# Patient Record
Sex: Female | Born: 1990 | Race: Black or African American | Hispanic: No | Marital: Single | Smoking: Never smoker
Health system: Southern US, Community
[De-identification: ages and names within clinical notes are randomized; demographics above are authoritative.]

## PROBLEM LIST (undated history)

## (undated) DIAGNOSIS — K589 Irritable bowel syndrome without diarrhea: Secondary | ICD-10-CM

---
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
## 2009-12-22 ENCOUNTER — Emergency Department (HOSPITAL_COMMUNITY): Admission: EM | Admit: 2009-12-22 | Discharge: 2009-12-23 | Payer: Self-pay | Admitting: Emergency Medicine

## 2010-09-30 ENCOUNTER — Emergency Department (HOSPITAL_COMMUNITY): Payer: PRIVATE HEALTH INSURANCE

## 2010-09-30 ENCOUNTER — Emergency Department (HOSPITAL_COMMUNITY)
Admission: EM | Admit: 2010-09-30 | Discharge: 2010-09-30 | Disposition: A | Discharge status: Home / Self Care | Payer: PRIVATE HEALTH INSURANCE | Attending: Emergency Medicine | Admitting: Emergency Medicine

## 2010-09-30 DIAGNOSIS — R109 Unspecified abdominal pain: Secondary | ICD-10-CM | POA: Insufficient documentation

## 2010-09-30 LAB — COMPREHENSIVE METABOLIC PANEL
ALT: 38 U/L — ABNORMAL HIGH (ref 0–35)
BUN: 8 mg/dL (ref 6–23)
CO2: 26 mEq/L (ref 19–32)
Calcium: 9.3 mg/dL (ref 8.4–10.5)
GFR calc Af Amer: >60 mL/min (ref >60)
GFR calc non Af Amer: >60 mL/min (ref >60)
Glucose, Bld: 90 mg/dL (ref 70–99)
Sodium: 138 mEq/L (ref 135–145)

## 2010-09-30 LAB — DIFFERENTIAL
Basophils Relative: 0 % (ref 0–1)
Lymphocytes Relative: 37 % (ref 12–46)
Lymphs Abs: 2.1 10*3/uL (ref 0.7–4.0)
Monocytes Relative: 8 % (ref 3–12)
Neutro Abs: 3.1 10*3/uL (ref 1.7–7.7)
Neutrophils Relative %: 54 % (ref 43–77)

## 2010-09-30 LAB — URINALYSIS, ROUTINE W REFLEX MICROSCOPIC
Glucose, UA: NEGATIVE mg/dL
Hgb urine dipstick: NEGATIVE
Ketones, ur: NEGATIVE mg/dL
Protein, ur: NEGATIVE mg/dL
Urobilinogen, UA: 0.2 mg/dL (ref 0.0–1.0)

## 2010-09-30 LAB — CBC
HCT: 36.1 % (ref 36.0–46.0)
Hemoglobin: 11.6 g/dL — ABNORMAL LOW (ref 12.0–15.0)
MCH: 27 pg (ref 26.0–34.0)
MCV: 84.1 fL (ref 78.0–100.0)
RBC: 4.29 MIL/uL (ref 3.87–5.11)

## 2010-09-30 LAB — LIPASE, BLOOD: Lipase: 18 U/L (ref 11–59)

## 2011-02-18 ENCOUNTER — Encounter: Payer: Self-pay | Admitting: *Deleted

## 2011-02-18 ENCOUNTER — Emergency Department (HOSPITAL_COMMUNITY)
Admission: EM | Admit: 2011-02-18 | Discharge: 2011-02-18 | Disposition: A | Discharge status: Home / Self Care | Payer: PRIVATE HEALTH INSURANCE | Attending: Emergency Medicine | Admitting: Emergency Medicine

## 2011-02-18 DIAGNOSIS — R350 Frequency of micturition: Secondary | ICD-10-CM | POA: Insufficient documentation

## 2011-02-18 DIAGNOSIS — N390 Urinary tract infection, site not specified: Secondary | ICD-10-CM | POA: Insufficient documentation

## 2011-02-18 LAB — URINALYSIS, ROUTINE W REFLEX MICROSCOPIC
Bilirubin Urine: NEGATIVE
Protein, ur: 30 mg/dL — AB
Urobilinogen, UA: 0.2 mg/dL (ref 0.0–1.0)

## 2011-02-18 LAB — URINE MICROSCOPIC-ADD ON

## 2011-02-18 LAB — PREGNANCY, URINE: Preg Test, Ur: NEGATIVE

## 2011-02-18 MED ORDER — CIPROFLOXACIN HCL 500 MG PO TABS: 500.0000 mg | ORAL_TABLET | Freq: Two times a day (BID) | ORAL | Status: AC

## 2011-02-18 NOTE — ED Provider Notes (Signed)
Medical screening examination/treatment/procedure(s) were performed by non-physician practitioner and as supervising physician I was immediately available for consultation/collaboration.  Juliet Rude. Rubin Payor, MD 02/18/11 1627

## 2011-02-18 NOTE — ED Notes (Signed)
Pt reports frequent urination with pain since Friday

## 2011-02-18 NOTE — ED Provider Notes (Signed)
History     CSN: [id number] Arrival date & time: 02/18/2011 11:00 AM   First MD Initiated Contact with Patient 02/18/11 1328    Patient is a 20 y.o. female presenting with frequency. The history is provided by the patient.  Urinary Frequency This is a new problem. The current episode started in the past 7 days. The problem occurs constantly. The problem has been gradually worsening. Pertinent negatives include no abdominal pain, chills, fever, nausea or vomiting. Associated symptoms comments: Urinary urgency, dribbling. Exacerbated by: Urination. She has tried nothing for the symptoms.    History reviewed. No pertinent past medical history.  History reviewed. No pertinent past surgical history.  No family history on file.  History  Substance Use Topics  . Smoking status: Never Smoker   . Smokeless tobacco: Not on file  . Alcohol Use: Yes    OB History    Grav Para Term Preterm Abortions TAB SAB Ect Mult Living                  Review of Systems  Constitutional: Negative for fever and chills.  Gastrointestinal: Negative for nausea, vomiting and abdominal pain.  Genitourinary: Positive for urgency and frequency. Negative for dysuria, vaginal bleeding, vaginal discharge and vaginal pain.    Allergies  Review of patient's allergies indicates no known allergies.  Home Medications  No current outpatient prescriptions on file.  BP 111/65  Pulse 64  Temp(Src) 98 F (36.7 C) (Oral)  Resp 18  SpO2 100%  Physical Exam  Vitals reviewed. Constitutional: She is oriented to person, place, and time. Vital signs are normal. She appears well-developed and well-nourished.  HENT:  Head: Normocephalic and atraumatic.  Eyes: Conjunctivae are normal. Pupils are equal, round, and reactive to light.  Neck: Normal range of motion. Neck supple.  Cardiovascular: Normal rate, regular rhythm and normal heart sounds.  Exam reveals no friction rub.   No murmur heard. Pulmonary/Chest:  Effort normal and breath sounds normal. She has no wheezes. She has no rhonchi. She has no rales. She exhibits no tenderness.  Abdominal: Soft. Bowel sounds are normal. She exhibits no distension and no mass. There is no tenderness. There is no rebound, no guarding and no CVA tenderness.  Musculoskeletal: Normal range of motion.  Neurological: She is alert and oriented to person, place, and time. Coordination normal.  Skin: Skin is warm and dry. No rash noted. No erythema. No pallor.    ED Course  Procedures   Labs Reviewed  URINALYSIS, ROUTINE W REFLEX MICROSCOPIC - Abnormal; Notable for the following:    Appearance TURBID (*)    Hgb urine dipstick MODERATE (*)    Protein, ur 30 (*)    Leukocytes, UA LARGE (*)    All other components within normal limits  URINE MICROSCOPIC-ADD ON - Abnormal; Notable for the following:    Squamous Epithelial / LPF FEW (*)    Bacteria, UA MANY (*)    All other components within normal limits  PREGNANCY, URINE  URINE CULTURE   Will treat for UTI   MDM          Thomasene Lot, PA 02/18/11 1346

## 2011-02-20 LAB — URINE CULTURE: Culture  Setup Time: 201211111739

## 2011-02-21 NOTE — ED Notes (Signed)
+   urine Patient treated with Cipro-sensitive to same-chart appended per protocol MD, 

## 2011-03-30 ENCOUNTER — Encounter: Payer: Self-pay | Admitting: Gynecology

## 2011-03-30 ENCOUNTER — Ambulatory Visit (INDEPENDENT_AMBULATORY_CARE_PROVIDER_SITE_OTHER): Payer: PRIVATE HEALTH INSURANCE | Admitting: Gynecology

## 2011-03-30 DIAGNOSIS — R350 Frequency of micturition: Secondary | ICD-10-CM

## 2011-03-30 DIAGNOSIS — R635 Abnormal weight gain: Secondary | ICD-10-CM

## 2011-03-30 DIAGNOSIS — R3915 Urgency of urination: Secondary | ICD-10-CM

## 2011-03-30 DIAGNOSIS — Z113 Encounter for screening for infections with a predominantly sexual mode of transmission: Secondary | ICD-10-CM

## 2011-03-30 DIAGNOSIS — IMO0001 Reserved for inherently not codable concepts without codable children: Secondary | ICD-10-CM

## 2011-03-30 DIAGNOSIS — Z01419 Encounter for gynecological examination (general) (routine) without abnormal findings: Secondary | ICD-10-CM

## 2011-03-30 NOTE — Progress Notes (Signed)
Cathy Ayers 03/03/1991 [id number]   History:    20 y.o.  for annual exam . Patient stated that she had a Pap smear several years ago. Patient denies any past history of dysplasia. She frequently does her self breast examination. Her poor cycle to be regular monthly. By using any form of contraception. Patient stated that she had been on different birth control pills in the past. Patient is sexually active. Patient has completed the Gardasil vaccine. She had been treated recently for urinary tract infection and still was having some frequency. No dysuria reported fever chills nausea or vomiting. Patient concerned about her weight.  Past medical history,surgical history, family history and social history were all reviewed and documented in the EPIC chart.  Gynecologic History Patient's last menstrual period was 03/07/2011. Contraception: none Last Pap: Unknown. Results were: normal Last mammogram: Not indicated. Results were: Not indicated  Obstetric History OB History    Grav Para Term Preterm Abortions TAB SAB Ect Mult Living   0                ROS:  Was performed and pertinent positives and negatives are included in the history.  Exam: chaperone present  BP 132/90  Ht 5\' 5"  (1.651 m)  Wt 262 lb (118.842 kg)  BMI 43.60 kg/m2  LMP 03/07/2011  Body mass index is 43.60 kg/(m^2).  General appearance : Well developed well nourished female. No acute distress HEENT: Neck supple, trachea midline, no carotid bruits, no thyroidmegaly Lungs: Clear to auscultation, no rhonchi or wheezes, or rib retractions  Heart: Regular rate and rhythm, no murmurs or gallops Breast:Examined in sitting and supine position were symmetrical in appearance, no palpable masses or tenderness,  no skin retraction, no nipple inversion, no nipple discharge, no skin discoloration, no axillary or supraclavicular lymphadenopathy Abdomen: no palpable masses or tenderness, no rebound or guarding Extremities: no  edema or skin discoloration or tenderness  Pelvic:  Bartholin, Urethra, Skene Glands: Within normal limits             Vagina: No gross lesions or discharge  Cervix: No gross lesions or discharge  Uterus  anteverted, normal size, shape and consistency, non-tender and mobile  Adnexa  Without masses or tenderness  Anus and perineum  normal   Rectovaginal  normal sphincter tone without palpated masses or tenderness             Hemoccult not indicated     Assessment/Plan:  20 y.o. female for annual exam having issues with her weight. She weight today 262 pounds with a BMI of 43.6. We discussed importance of adequate nutrition and exercise for which a protocol and instructions were provided today. We'll check a TSH along with a random blood sugar as well as her CBC urinalysis GC and Chlamydia culture and Pap smear. Her urinalysis was negative we'll run a culture because of her symptoms of frequency although it appears because she consumes large quantities of water during the day. Will notify does any abnormality of any the above mentioned test otherwise we'll see her back in one year or when necessary. Of note literature formation was provided on the ParaGard T380A IUD. Patient declined flu vaccine.    Ok Edwards MD, 3:01 PM 03/30/2011

## 2011-03-30 NOTE — Patient Instructions (Signed)
Exercise to Lose Weight Exercise and a healthy diet may help you lose weight. Your doctor may suggest specific exercises. EXERCISE IDEAS AND TIPS  Choose low-cost things you enjoy doing, such as walking, bicycling, or exercising to workout videos.   Take stairs instead of the elevator.   Walk during your lunch break.   Park your car further away from work or school.   Go to a gym or an exercise class.   Start with 5 to 10 minutes of exercise each day. Build up to 30 minutes of exercise 4 to 6 days a week.   Wear shoes with good support and comfortable clothes.   Stretch before and after working out.   Work out until you breathe harder and your heart beats faster.   Drink extra water when you exercise.   Do not do so much that you hurt yourself, feel dizzy, or get very short of breath.  Exercises that burn about 150 calories:  Running 1  miles in 15 minutes.   Playing volleyball for 45 to 60 minutes.   Washing and waxing a car for 45 to 60 minutes.   Playing touch football for 45 minutes.   Walking 1  miles in 35 minutes.   Pushing a stroller 1  miles in 30 minutes.   Playing basketball for 30 minutes.   Raking leaves for 30 minutes.   Bicycling 5 miles in 30 minutes.   Walking 2 miles in 30 minutes.   Dancing for 30 minutes.   Shoveling snow for 15 minutes.   Swimming laps for 20 minutes.   Walking up stairs for 15 minutes.   Bicycling 4 miles in 15 minutes.   Gardening for 30 to 45 minutes.   Jumping rope for 15 minutes.   Washing windows or floors for 45 to 60 minutes.  Document Released: 04/28/2010 Document Revised: 12/06/2010 Document Reviewed: 04/28/2010 [facility] Patient Information 2012 [place], [place].                                           Dietary therapy for weight gain   INTRODUCTION - The optimal management of overweight and obesity requires a combination of diet, exercise, and behavioral modification. In addition, some patients  eventually require pharmacologic therapy or bariatric surgery. The risk of overweight to the subject should be evaluated before beginning any treatment program. Selection of treatment can then be made using a risk-benefit assessment). The choice of therapy is dependent on several factors including the degree of overweight or obesity and patient preference.  This topic will review the dietary therapy of obesity. Other aspects of treatment are discussed separately. (See "Health hazards associated with obesity in adults" and "Overview of therapy for obesity in adults" and "Drug therapy of obesity" and "Behavioral strategies in the treatment of obesity".) GOALS OF WEIGHT LOSS - It is important to set goals when discussing a dietary weight loss program with an individual patient. An initial weight loss goal of 5 to 7 percent of body weight is realistic for most individuals. The first goal for any overweight individual is to prevent further weight gain and keep body weight stable (within 5 pounds of its current level).  The goal of the clinician is to identify and review with the patient a realistic weight-loss goal. Most patients have a weight loss goal of 30 percent or more below current weight, which  is unrealistic [1].  A successful program will lead to a weight loss of more than 5 percent of initial weight [2]. A weight loss of more than 5 percent can reduce risk factors for cardiovascular disease, such as dyslipidemia, hypertension, and diabetes mellitus [3]. In the Diabetes Prevention Program, a multi-center trial in patients with impaired glucose tolerance, weight loss of 7 percent reduced the rate of progression from impaired glucose tolerance to diabetes by 58 percent [4]. (See "Prediction and prevention of type 2 diabetes mellitus", section on 'Diabetes Prevention Program'.)  Loss of 5 percent of initial body weight and maintenance of this loss is a good medical result, even if the subject does not reach  his or her "dream" weight.  Although an extremely difficult goal to achieve, a body mass index (BMI) between 20 and 25 kg/m2 puts the subject in the lowest risk category (table 1 and figure 1). DIETARY ENERGY Rate of weight loss - The rate of weight loss is directly related to the difference between the subject's energy intake and energy requirements. Reducing caloric intake below expenditure results in a predictable initial rate of weight loss that is related to the energy deficit [5,6]. However, prediction of weight loss for an individual subject can be difficult because of marked intersubject variability in initial body composition, adherence, and energy expenditure [5,7]. Food records are often inaccurate. Most normal-weight people under-report what they eat by 10 to 30 percent, while overweight people under-report by 30 percent or more [8]. In addition, energy requirements are influenced by fidgeting, gender, age, and genetic factors [5,6,9]. As examples: Men lose more weight than women of similar height and weight when they comply with eating any given diet because men have more lean body mass, less percent body fat, and therefore higher energy expenditure.  Older subjects of either sex have a lower energy expenditure and therefore lose weight more slowly than younger subjects; metabolic rate declines by approximately 2 percent per decade (about 100 kcal/decade) [10].  The importance of genetic factors is illustrated by a study of identical female twin pairs who were overfed to induce weight gain [11]. Twelve twin pairs were overfed by 1000 kcal/day for 84 of 100 days. The degree of weight gain at a constant dietary caloric increment varied widely among the twin pairs (from 4.3 to 13.3 kg), in fact, there was three times the variance for both weight and fat mass among the twin pairs when compared with that within the twin pairs. Approximately 22 kcal/kg is required to maintain a kilogram of body weight in  a normal adult. Thus, the expected or calculated energy expenditure for a woman weighing 100 kg is approximately 2200 kcal/day. The variability of 20 percent could give energy needs as high as 2620 kcal/day or as low as 1860 kcal/day. An average deficit of 500 kcal/day should result in an initial weight loss of approximately 0.5 kg/week (1 lb/week). However, after three to six months of weight loss, energy expenditure adaptations occur, which slow the bodyweight response to a given change in energy intake, thereby diminishing ongoing weight loss [7]. There are several methods of formally estimating energy expenditure; we suggest using the WHO criteria (table 2). This method allows a direct estimate of resting metabolic rate (RMR) and calculation of daily energy requirement. The low activity level (1.3 x RMR) includes subjects who lead a sedentary life. The high activity level (1.7 x RMR) applies to those in jobs requiring manual labor or patients with regular daily physical exercise  programs [12]. Maintenance of weight loss - It is important for the overweight subject to understand that achieving and maintaining weight loss is made difficult by the reduction in energy expenditure that is induced by weight loss (figure 2) [13]. Weight loss maintenance is also difficult because of changes in the peripheral hormone signals that regulate appetite. Gastrointestinal peptides, such as ghrelin, which stimulates appetite, and gastric inhibitory polypeptide, which may promote energy storage, increase after diet-induced weight loss. Other circulating mediators that inhibit intake (eg, leptin, peptide YY, cholecystokinin, pancreatic polypeptide) decrease. These hormonal adaptations favoring weight gain persist for at least one year after diet-induced weight loss [14]. (See "Overview of therapy for obesity in adults", section on 'Maintenance of weight loss' and "Pathogenesis of obesity", section on 'Ghrelin'.)  TYPES OF  DIETS - The general consensus is that excess intake of calories from any source, associated with a sedentary lifestyle, causes weight gain and obesity. The goal of dietary therapy, therefore, is to decrease energy intake from food. Conventional diets are defined as those below energy requirements but above 800 kcal/day [15]. These diets fall into four groups: Balanced low-calorie diets/portion-controlled diets  Low-fat diets  Low-carbohydrate diets  Mediterranean diet  Fad diets (diets involving unusual combinations of foods or eating sequences) Commercial weight loss programs and internet-based programs are discussed elsewhere. (See "Behavioral strategies in the treatment of obesity".) Balanced low-calorie diets - Planning a diet requires the selection of a caloric intake and then selection of foods to meet this intake. It is desirable to eat foods with adequate nutrients in addition to protein, carbohydrate, and essential fatty acids. Thus, weight-reducing diets should eliminate alcohol, sugar-containing beverages, and most highly concentrated sweets because they rarely contain adequate amounts of other nutrients besides energy. Breakdown of some protein is to be expected during weight loss. When weight increases as a result of overeating, approximately 75 percent of the extra energy is stored as fat and the remaining 25 percent as lean tissue. If the lean tissue contains 20 percent protein, then 5 percent of the extra weight gain would be protein. Thus, it should be anticipated that during weight loss, at least 5 percent of weight loss will be protein. A desirable feature of any calorie restricted diet, however, is that it results in the lowest possible loss of protein, recognizing that this will not be less than 5 percent of the weight that is lost. Portion-controlled diets - One simple approach to providing a calorie-controlled diet is to use individually packaged foods, such as formula diet drinks  using powdered or liquid formula diets, nutrition bars, frozen food, and pre-packaged meals that can be stored at room temperature as the main source of nutrients. Frozen low-calorie meals containing 250 to 350 kcal/package can be a convenient and nutritious way to do this. We have often recommended the use of formula diets or breakfast bars for breakfast, formula diets or a frozen lunch entree for lunch, and a frozen calorie-controlled entree with additional vegetables for dinner. In this way, it is possible to obtain a calorie-controlled 1000 to 1500 kcal per day diet. In one four-year study this approach resulted in early initial weight loss, which then was maintained [16]. I do not recommend the use of formula diets alone because they do not provide adequate nutritional variety. Low-fat diets - Low-fat diets are another standard strategy to help patients lose weight, and almost all dietary guidelines recommend a reduction in the daily intake of fat to 30 percent of energy intake or less [  17,18]. In a meta-analysis of trials comparing low-fat diets (typically 20 to 25 percent of energy from fats) with a control group consuming a usual diet or a medium fat diet (usually 35 to 40 percent of energy), there was greater weight loss (approximately 3 kg) with low-fat compared with moderate fat diets [19]. In addition, one report noted that people who successfully keep their weight reduced adopt three strategies, one of which is eating a lower fat diet [20]. (See "Dietary fat" and "Etiology and natural history of obesity", section on 'Dietary habits'.) A low-fat dietary pattern with healthy carbohydrates is not associated with weight gain. This was illustrated by the [facility] Dietary Modification Trial of [phone] postmenopausal women over age 27 years who were randomly assigned to a dietary intervention that included group and individual sessions to promote a decrease in fat intake and increases in  fruit, vegetable, and grain consumption (healthy carbohydrates), but did not include weight loss or caloric restriction goals, or a control group which received only dietary educational materials [21]. After an average of 7.5 years of follow-up, the following results were seen: Women in the intervention group lost weight in the first year (mean of 2.2 kg) and maintained lower weight than the control women at 7.5 years (difference of 1.9 kg at one year, and 0.4 kg at 7.5 years).  No tendency toward weight gain was seen in the intervention group overall, or when stratified by age, ethnicity, or body mass index.  Weight loss was related to the level of fat intake and was greatest in women who decreased their percentage of energy from fat the most. A similar, but lesser trend was seen with increased vegetable and fruit intake. A low-fat diet can be implemented in two ways. First, the dietitian can provide the subject with specific menu plans that emphasize the use of reduced fat foods. As one guideline, if a food "melts" in your mouth, it probably has fat in it. Second, subjects can be instructed in counting fat grams as an alternative to counting calories. Fat has 9.4 kcal/g. It is thus very easy to calculate the number of grams of fat a subject can eat for any given level of energy intake. Many experts recommend keeping calories from fat to below 30 percent of total calories. In practical terms, this means eating about 33 g of fat for each 1000 calories in the diet. For simplicity, I use 30 g of fat or less for each 1000 kcal. For a 1500-calorie diet, this would mean about 45 g or less of fat, which can be counted using the nutrition information labels on food packages. Low-carbohydrate diets - Proponents of low-carbohydrate diets have argued that the increasing obesity epidemic may be in part due to low-fat, high-carbohydrate diets. But this may be dependent upon the type of carbohydrates that are eaten, such  as energy dense snacks and sugar or high fructose containing beverages. The carbohydrate content of the diet is an important determinant of short-term (less than two weeks) weight loss. Low (60 to 130 grams of carbohydrates) and very low-carbohydrate diets (0 to <60 grams) have been popular for many years [15]. Restriction of carbohydrates leads to glycogen mobilization and, if carbohydrate intake is less than 50 g/day, ketosis will develop. Rapid weight loss occurs, primarily due to glycogen breakdown and fluid loss rather than fat loss. Low and very low-carbohydrate diets are more effective for short-term weight loss than low-fat diets, although probably not for long-term weight loss. A meta-analysis  of five trials found that the difference in weight loss at six months, favoring the low carbohydrate over low fat diet, was not sustained at 12 months [22]. (See 'Comparison trials' below.) Low-carbohydrate diets may have some other beneficial effects with regard to risk of developing type 2 diabetes mellitus, coronary heart disease, and some cancers, particularly if attention is paid to the type as well as the quantity of carbohydrate. A low-carbohydrate diet can be implemented in two ways, either by reducing the total amount of carbohydrate or by consuming foods with a lower glycemic index or glycemic load (table 3). Glycemic index and load are reviewed separately. (See "Dietary carbohydrates", section on 'Glycemic index'.) If a low-carbohydrate diet is chosen, healthy choices for fat (mono- and polyunsaturated fats) and protein (fish, nuts, legumes, and poultry) should be encouraged because of the association between saturated fat intake and risk of coronary heart disease. During 26 years of follow-up of women in the Nurses' Health Study and 20 years of follow-up of men in the Health Professionals' Follow-up Study, low carbohydrate diets in the highest versus lowest decile for vegetable proteins and fat were  associated with lower all-cause mortality (HR 0.80, 95% CI 0.75-0.85) and cardiovascular mortality (HR 0.77, 95% CI 0.68-0.87) [23]. In contrast, low carbohydrate diets in the highest versus lowest decile for animal protein and fat were associated with higher all-cause (HR 1.23, 95% CI 1.11-1.37) and cardiovascular (HR 1.14, 95% CI 1.01-1.29) mortality. (See "Dietary fat" and "Overview of primary prevention of coronary heart disease and stroke", section on 'Healthy diet'.) High protein diets - Some popular books recommend high protein diets [24]. In one trial, low-fat diets with 12 percent and 25 percent protein content were compared. Weight loss over six months was greater with the higher protein diet (9 versus 5 kg), but the difference was no longer significant at 12 and 24 months [25]. Higher protein diets may improve weight maintenance, as illustrated by the results of a study of 60 subjects randomly assigned to a low fat, high protein versus low-fat, high-carbohydrate diet after completing a four week very low calorie diet [26]. Among the subjects who completed the three-month study (n = 48), the high protein diet group had significantly better weight maintenance (between group difference of 2.3 kg). High dietary protein intake, due to its acid-producing load, increases urinary calcium excretion (with potential risk for bone loss and calcium stone formation) [27]. Urinary calcium excretion does appear to increase when dietary intake of protein increases [27-29], and this could pose a long-term risk for nephrolithiasis. (See "Risk factors for calcium stones in adults", section on 'Dietary risk factors'.) However, two small randomized trials that looked at bone metabolism found evidence that increased dietary protein may decrease bone resorption [28,29]. One of the trials found that increased intestinal absorption of calcium was primarily responsible for the increased urinary excretion of calcium and that the  excreted calcium was not coming from bone [29]. Mediterranean diet - The term Mediterranean diet refers to a dietary pattern that is common in [place] areas of the [place] area. Although there is some variation in Mediterranean diets, there are some common components that include a high level of monounsaturated fat relative to saturated; moderate consumption of alcohol, mainly as wine; a high consumption of vegetables, fruits, legumes, and grains; a moderate consumption of milk and dairy products, mostly in the form of cheese; and a relatively low intake of meat and meat products. A meta-analysis of 12 studies involving eight cohorts found that a  Mediterranean diet was associated with improved health status and reductions in overall mortality, cardiovascular mortality, cancer mortality, and incidence of Parkinson's disease and Alzheimer's disease [30]. (See "Healthy diet in adults", section on 'Mediterranean diet'.) Very low-calorie diets - Diets with energy levels between 200 and 800 kcal/day are called "very low-calorie diets," while those below 200 kcal/day can be termed starvation diets. The basis for these diets was the notion that the lower the calorie intake the more rapid the weight loss, because the energy withdrawn from body fat stores is a function of the energy deficit. Starvation is the ultimate very low-calorie diet and results in the most rapid weight loss. Although once popular, starvation diets are now rarely used for treatment of obesity. Very low-calorie diets have not been shown to be superior to conventional diets for long-term weight loss. In a meta-analysis of six trials comparing very low-calorie diets with conventional low-calorie diets, short-term weight loss was greater with very low-calorie diets (16.1 versus 9.7 versus percent of initial weight), but there was no difference in long-term weight loss (6.3 versus 5.0 percent) [31]. As with all diets, very low-calorie diets  initially result in substantial protein loss that diminishes with time. Other expected effects include reduction in blood pressure and improvement in hyperglycemia in diabetic patients. Subjects adhering to very low-calorie diets usually have a fall in blood pressure, especially during the first week. Antihypertensive drugs, especially calcium channel blockers and diuretics, should usually be discontinued when a very low calorie diet is begun unless moderate to severe hypertension is present.  Most diabetic patients eating very low-calorie diets have marked improvement in hyperglycemia. Blood glucose concentrations fall within the first one to two weeks, and remain lower as long as the diet is continued. Those patients taking less than 50 units of insulin or an oral hypoglycemic drug will usually be able to discontinue therapy [32]. The side effects of very low-calorie diets include hair loss, thinning of the skin, and coldness. These diets are contraindicated for lactating and pregnant women, and in children who require protein for linear growth. As with all diets, there is increased cholesterol mobilization from peripheral fat stores, thus increasing the risk of gallstones. Very low-calorie diets should be reserved for subjects who require rapid weight loss for a specific purpose, such as surgery. The weight regain when the diet is stopped is often rapid, and it is better to take a more sustainable approach than to use a method that cannot be sustained. Comparison trials - The impact of specific dietary composition on weight change remains uncertain. When energy from dietary carbohydrates decreases, energy from fat sources tends to increase. The reverse is also true; when energy from dietary fats decreases, energy from carbohydrate sources tends to increase. The debate has mainly centered on whether low-fat or low-carbohydrate diets can better induce weight loss and sustain it over the long-term. Weight loss  diets - Initial trials evaluating the effect of type of diet (predominantly low-carbohydrate versus low-fat) on weight loss and other outcomes showed that weight loss at six months was approximately 4 kg greater in the very low-carbohydrate group than in the low-fat group [33-35]. Trials lasting for one year, however, did not find a significant difference in weight loss [34,36,37]. A meta-analysis of five trials (including one study not referenced above) found that the difference in weight loss at six months, favoring the low carbohydrate over low fat diet, was not sustained at 12 months [22]. In one study, this convergence was mainly due to  regain of weight in the low-carbohydrate group [34]; in another, the convergence was due to ongoing weight loss in the low-fat group (figure 3) [36]. Some of these initial comparison trials of different dietary regimens had important limitations [22]. These included high dropout rates (21 to 48 percent), suboptimal dietary compliance, and limited long-term follow-up. Subsequent trials are larger, of longer duration (lasting one to two years), and have conflicting results with regard to the impact of macronutrient composition on weight loss [38-41]. In contrast, all trials found that dietary adherence is an important determinant of weight loss, independent of macronutrient composition. The following observations illustrate the range of findings in these trials: In one trial, 322 moderately obese subjects (86 percent men) were randomly assigned to a low-fat (restricted calorie), Mediterranean (moderate-fat, restricted calorie, rich in vegetables, low in red meat), or low-carbohydrate (non-restricted-calorie) diet for two years [38]. Adherence rates were higher than those reported in previous trials (95.4 and 84.6 percent at one and two years, respectively). Weight loss was greater with the Mediterranean and low-carbohydrate diets than the low-fat diet (mean weight loss 4.4,  4.7, and 2.9 kg, respectively).  The most favorable effect on lipids (increased HDL and decreased triglycerides and ratio of total cholesterol to HDL) was seen in the low-carbohydrate group. Among subjects with type 2 diabetes, the greatest improvement in glycemic control occurred with the Mediterranean diet. Among all groups, weight loss was greater for those who completed the two year study than for those who withdrew.  Another randomized trial compared four different diets in 311 overweight and obese premenopausal women: very low-carbohydrate (Atkins); macronutrient balance controlling glycemic load (Zone); general calorie restriction, low-fat (LEARN); and very low-fat ([place]) [39]. In the intention-to-treat analysis at one year, mean weight loss was greater in the Atkins diet group compared with the other groups (4.7, 1.6, 2.2, and 2.6 kg, respectively). Pairwise comparisons showed a significant difference only for Atkins versus Zone.  The most favorable effect on triglycerides and HDL-C was seen in the Atkins group. Dietary adherence rates (77 to 88 percent) were similar among the groups and better than in previous trials. Within each group, adherence was significantly associated with weight loss [42].  In the largest trial to date, 811 overweight and obese adults were randomly assigned to one of four diets based upon macronutrient content: low or high fat (20 to 40 percent), which provided carbohydrate at 35, 45, 55, or 65 percent, and high or average protein (15 to 25 percent) [40]. After six months, mean weight loss in each group was 6 kg. By two years, mean weight loss was 3 to 4 kg, and weight losses remained similar in all groups. Many participants had trouble attaining target levels of macronutrients. Subjects who attended the greatest number of group sessions (most adherent) lost the most weight. Thus, any diet that is adhered to will produce modest weight loss, but adherence rates are low with  most diets. Although a low-carbohydrate diet may be associated with greater short-term weight loss, superior weight loss in the long-term has not been established. The optimal mix of macronutrients likely depends upon individual factors [43]. A principal determinant of weight loss appears to be the degree of adherence to the diet, irrespective of the particular macronutrient composition [37,39,40,42,44,45]. Thus, we suggest choosing a macronutrient mix based upon patient preferences, which may improve long-term adherence. Behavioral modification to improve dietary compliance with any type of diet may have the greatest impact on long-term weight loss. (See "Behavioral strategies in the treatment  of obesity".) Lipids - The observed effects on blood lipids were similar for trials comparing low fat and very low carbohydrate diets [22,33-36,46]; the low-carbohydrate/high-fat diets caused slight increases in HDL, and greater decreases in fasting triglycerides. At 12 to 24 months, however, the favorable effects on HDL persisted [34,36,37,41], while triglyceride levels were either reduced [34,36] or returned to baseline [37]. In a meta-analysis of trials comparing low-carbohydrate and low-fat diet groups, LDL levels were increased in the low-carbohydrate group [22]. There was no clear benefit of either low-fat or low-carbohydrate diet on cardiovascular risks. Favorable changes in HDL cholesterol and triglycerides should be weighed against potential unfavorable changes in LDL cholesterol.  Side effects - Very low-carbohydrate diets may be associated with more frequent side effects than low-fat diets. In one of the trials noted above, a number of symptoms occurred significantly more frequently in the low-carbohydrate compared to the low-fat diet group [33]. These included constipation (68 versus 35 percent), headache (60 versus 40 percent), halitosis (38 versus 8 percent), muscle cramps (35 versus 7 percent), diarrhea (23  versus 7 percent), general weakness (25 versus 8 percent), and rash (13 versus 0 percent) [33]. Despite the higher rate of symptoms, dropout rates in clinical trials have been similar for low-carbohydrate and low-fat diets [34-36]. Some have raised the concern about ketosis that occurs with very low-carbohydrate diets. There is one case report of an obese patient who presented in severe ketoacidosis, having lost 9 kg in one month on the Atkins diet, with intake restricted to meat, cheese, and salads [47]. Aside from her diet and possible mild dehydration due to gastroenteritis, no other cause for her ketoacidosis was identified. Weight maintenance diets - Although many individuals have success losing weight with diet, most subsequently regain much or all of the lost weight. Maintaining weight loss is made difficult by the reduction in energy expenditure that is induced by weight loss. In addition, long-term adherence to restrictive diets is difficult. Exercise and behavioral interventions may help individuals maintain weight loss. These strategies are reviewed in detail elsewhere. (See "Role of physical activity and exercise in obesity", section on 'Maintenance of weight loss' and "Behavioral strategies in the treatment of obesity", section on 'Maintenance of weight loss'.) There is little consensus on the optimal mix of macronutrients to maintain weight loss. The satiating effects of high protein, low glycemic index diets have generated interest in manipulating protein composition and glycemic index in weight maintenance diets. (See 'High protein diets' above and "Dietary carbohydrates", section on 'Effect of glycemic index/glycemic load'.) In a multicenter trial of five ad libitum diets to prevent weight regain over 26 weeks, 773 adults who had successfully lost 8 percent of their body weight on a low calorie diet (800 to 1000 kcal/day), were randomly assigned in a two-by-two factorial design to a high or  low-protein (25 versus 13 percent of total calories), high or low-glycemic index, or to a control diet (moderate protein content) [48]. All diets had a moderate fat content (25 to 30 percent). The achieved protein content was 5 percentage points higher in the high versus low protein groups, and the mean glycemic index was five units lower in the low-glycemic versus high-glycemic index groups. In the intention-to-treat analysis, weight regain during the trial was modestly but significantly greater in the low versus high-protein groups (mean difference 0.93 kg) and in the high versus low-glycemic index groups (mean difference 0.95 kg). Only subjects in the high-protein, low-glycemic index diet group continued to lose weight (mean change -0.38 kg).  The trial was limited by the moderate dropout rate (29 percent) and short-term follow-up (six months). Whether a low glycemic index, high protein diet is associated with long-term weight maintenance is unknown. As discussed above, long-term adherence to a weight maintaining diet is probably the most important determinant of success, and therefore the optimal weight maintaining diet will depend upon preference and individual factors. Role of dietary counseling - Dietary counseling may produce modest, short-term weight losses. This topic is reviewed in detail elsewhere. (See "Behavioral strategies in the treatment of obesity", section on 'Elements of behavioral strategies' and "Behavioral strategies in the treatment of obesity", section on 'Efficacy'.)  Prolonged caloric restriction and longevity - Prolonged caloric restriction improves longevity in rodents and non-human primates [49], but it is not known if the same is true in humans. It is hypothesized that the antiaging effects of caloric restriction are due to reduced energy expenditure resulting in a reduction in production of reactive oxygen species (and therefore a reduction in oxidative damage). In addition, other  metabolic effects associated with caloric restriction, such as improved insulin sensitivity, might also have an antiaging effect. In one trial of 48 sedentary, overweight men and women, six months of caloric restriction, with or without exercise, resulted in significant weight loss as expected [50]. In addition, calorie restriction-mediated reductions in fasting insulin concentrations, core body temperature, serum T3 levels, and oxidative damage to DNA (as reflected by a reduction in DNA fragmentation) were seen, suggesting a possible antiaging effect of the prolonged caloric restriction. INFORMATION FOR PATIENTS - UpToDate offers two types of patient education materials, "The Basics" and "Beyond the Basics." The Basics patient education pieces are written in plain language, at the 5th to 6th grade reading level, and they answer the four or five key questions a patient might have about a given condition. These articles are best for patients who want a general overview and who prefer short, easy-to-read materials. Beyond the Basics patient education pieces are longer, more sophisticated, and more detailed. These articles are written at the 10th to 12th grade reading level and are best for patients who want in-depth information and are comfortable with some medical jargon. Here are the patient education articles that are relevant to this topic. We encourage you to print or e-mail these topics to your patients. (You can also locate patient education articles on a variety of subjects by searching on "patient info" and the keyword(s) of interest.)  Basics topics (see "Patient information: Diet and health (The Basics)" and "Patient information: Weight loss treatments (The Basics)")  Beyond the Basics topics (see "Patient information: Diet and health (Beyond the Basics)" and "Patient information: Weight loss treatments (Beyond the Basics)" and "Patient information: Weight loss surgery (Beyond the Basics)")  SUMMARY  AND RECOMMENDATIONS An initial weight loss goal of 5 to 7 percent of body weight is realistic for most individuals. (See 'Goals of weight loss' above.)  Many types of diets produce modest weight loss. Options include balanced low-calorie, low-fat low-calorie, moderate-fat low calorie, low-carbohydrate diets, and the Mediterranean diet. Dietary adherence is an important predictor of weight loss, irrespective of the type of diet. (See 'Types of diets' above.)  We suggest tailoring a diet that reduces energy intake below energy expenditure to individual patient preferences, rather than focusing on the macronutrient composition of the diet (Grade 2B). (See 'Comparison trials' above.)  If a low-carbohydrate diet is chosen, healthy choices for fat (mono and polyunsaturated) and protein (fish, nuts, legumes, and poultry) should be encouraged. If a  low-fat diet is chosen, the decrease in fat should be accompanied by increases in healthy carbohydrates (fruits, vegetables, whole grains).

## 2011-03-31 LAB — GC/CHLAMYDIA PROBE AMP, GENITAL
Chlamydia, DNA Probe: NEGATIVE
GC Probe Amp, Genital: NEGATIVE

## 2011-04-01 LAB — URINE CULTURE

## 2011-05-11 ENCOUNTER — Encounter: Payer: Self-pay | Admitting: Gynecology

## 2011-05-11 ENCOUNTER — Ambulatory Visit (INDEPENDENT_AMBULATORY_CARE_PROVIDER_SITE_OTHER): Payer: PRIVATE HEALTH INSURANCE | Admitting: Gynecology

## 2011-05-11 ENCOUNTER — Other Ambulatory Visit (HOSPITAL_COMMUNITY)
Admission: RE | Admit: 2011-05-11 | Discharge: 2011-05-11 | Disposition: A | Discharge status: Home / Self Care | Payer: PRIVATE HEALTH INSURANCE | Source: Ambulatory Visit | Attending: Gynecology | Admitting: Gynecology

## 2011-05-11 VITALS — BP 116/70

## 2011-05-11 DIAGNOSIS — N912 Amenorrhea, unspecified: Secondary | ICD-10-CM

## 2011-05-11 DIAGNOSIS — IMO0001 Reserved for inherently not codable concepts without codable children: Secondary | ICD-10-CM

## 2011-05-11 DIAGNOSIS — Z01419 Encounter for gynecological examination (general) (routine) without abnormal findings: Secondary | ICD-10-CM | POA: Insufficient documentation

## 2011-05-11 DIAGNOSIS — R87810 Cervical high risk human papillomavirus (HPV) DNA test positive: Secondary | ICD-10-CM | POA: Insufficient documentation

## 2011-05-11 DIAGNOSIS — R87616 Satisfactory cervical smear but lacking transformation zone: Secondary | ICD-10-CM

## 2011-05-11 DIAGNOSIS — Z309 Encounter for contraceptive management, unspecified: Secondary | ICD-10-CM

## 2011-05-11 LAB — PREGNANCY, URINE: Preg Test, Ur: NEGATIVE

## 2011-05-11 NOTE — Patient Instructions (Signed)
On Monday call Amy at [phone] and a prescription check her insurance coverage for the ParaGard T380A IUD. Waiting for your period starts and will call the office again and let the scheduling clerk know that we are saving an IUD for you in the year on her cycle so that we can get you can to insert it. Read the information provided.                                                                              . Contraceptive Devices (IUD)  IUD stands for intrauterine device. Intrauterine means inside the womb (uterus). The purpose of the IUD is to prevent pregnancy. IUDs make it more difficult for your partner's sperm to get into your womb and into your fallopian tubes, where the eggs are fertilized. IUDs also alter the secretions of your cervix, which make it a stronger sperm barrier. They also affect the lining of the womb, so it is harder for an egg to implant. The IUD does not cause an abortion. There are 2 types of IUDs available:  Copper IUD gives off a small amount of copper inside the uterus. This prevents the sperm from going through the uterus, up into the fallopian tube, where the egg is fertilized. The copper IUD can also damage or prevent the fertilized egg from attaching on the inside lining of the uterus. It can stay in place for 10 years. The copper IUD can be used as an emergency contraceptive, if inserted within 5 days after having unprotected sexual intercourse.   Hormone IUD contains progestin (synthetic progesterone), and it releases this hormone into the uterus. The hormone thickens the mucus on the cervix and prevents sperm from entering the uterus. It also weakens the sperm that get into the uterus, so that the sperm and egg cannot live in the fallopian tube. It also makes the inside lining of the uterus thinner, which makes it difficult for a fertilized egg to attach to the uterus. The hormone progestin in the IUD decreases the amount of bleeding during a menstrual period and can be  helpful in women who have heavy menstrual periods. The hormone IUD can stay in place for 5 years.  SIDE EFFECTS OF THE IUD:  There may be more cramping or pain with periods.   It may cause heavier, longer periods, which can cause lack of red blood cells (anemia) and can interfere with your daily and sexual activities.  This method of birth control is not usually the best choice for a woman with heavy or prolonged periods. The birth control pill may be a better choice. IUDs work best for women who have already had a pregnancy, because the cervix is more open, making the insertion of the device easier and less painful. However, many women without children use the IUD. One of the main goals of patient selection is to prevent unintentionally inserting an IUD into a patient who has an STD (sexually transmitted disease), who is at high risk of exposure to an STD, or who is already pregnant. That is why the IUD is inserted during, or right after, a menstrual period. REASONS NOT TO USE AN IUD:  The womb or cervix is  not shaped normally.   You have or have had a pelvic infection, such as an STD, in the past 3 months.   You have or suspect cancer in the female organs.   You have an abnormal Pap smear.   You have certain liver diseases.   There is severe infection or inflammation of the cervix (cervicitis).   You have unexplained vaginal bleeding.   You have heart valve problems (unless a heart specialist advises otherwise).   You are allergic to copper (rare).   You previously had a pregnancy outside the uterus (ectopic).   You are pregnant or suspect you are pregnant.   You have prolonged or heavy periods, or heavy pain or cramping with periods (except for the hormone IUD).   You have or suspect pelvic cancer.   You have an STD.   The cervix or uterus has problems (cervical stenosis, fibroids in the uterus) making it difficult to insert the IUD.  IUDs should be removed when a woman  becomes menopausal or pregnant. BENEFITS OF THE IUD:  You are always ready to have sexual intercourse.   The copper IUD does not interfere with your female hormones.   The copper IUD can be used as emergency contraception.   An IUD can be used while nursing.   It works immediately after insertion, and there is no problem getting pregnant when it is removed.   It does not interfere with foreplay.   The progesterone IUD can make heavy menstrual periods lighter.   The progesterone IUD can be used for 5 years.   The copper IUD can be used for 10 years.  RISKS AND COMPLICATIONS  Putting the IUD through the uterus, into the pelvis or abdomen (perforation of the uterus).   Losing the IUD (expulsion). This is more common in women who never had children.   When pregnant with an IUD, there is an increased chance of an infection and loss of the pregnancy.   Pregnancy in the fallopian tube (ectopic).   STD, in women who have more than 1 sex partner. The IUD does not protect against STDs.   Other minor side effects may include:   Headaches.   Feeling sick to your stomach (nausea).   Breast tenderness.   Depression.  PROCEDURE   The IUD is inserted during or right after a menstrual period, to make sure you are not pregnant.   You will lie on an exam table, naked from the waist down.   Your caregiver will do an exam to determine the size and position of your uterus.   Usually, an anesthetic is not needed.   Your caregiver may give you a pain pill to take, 1 or 2 hours before the procedure.   Sometimes, a paracervical block may be used to block and control any discomfort with insertion.   A tool (speculum) is then placed in your vagina (birth canal) so your caregiver can see the cervix.   A sound is sent into the uterus to check the depth of the uterus.   A slim instrument (IUD inserter), which is shaped like a drinking straw, is inserted through the small opening in your  cervix and into your uterus.   Then, the IUD is pushed in with a plunger, much like a syringe, and the inserter is removed. There may be some cramping and pain during the insertion.   Relaxing helps to lessen the discomfort.   Following the procedure, you will usually spot blood. Have  some pads with you. Avoid using tampons for 2 weeks. Bleeding after the procedure is normal. It varies from light spotting for a few days to menstrual-like bleeding for up to 3 weeks. It may be like a period if it is near the time for your normal period.   You may also have mild cramping. Only take over-the-counter or prescription medicines for pain, discomfort, or fever as directed by your caregiver. Do not use aspirin, as this may increase bleeding.   Practice checking the string coming out of the cervix, to make sure the IUD is always in the uterus.  HOME CARE INSTRUCTIONS   Do not drive for 24 hours.   Only take over-the-counter or prescription medicines for pain, discomfort, or fever as directed by your caregiver.   No tampons, douching, or sexual intercourse for 2 weeks, or until your caregiver approves.   Rest at home for 24 hours. You may then resume normal activities, unless told otherwise by your caregiver.   Check your IUD prior to resuming sexual activity, to make sure it is in place. Make sure that you can feel the strings. An IUD can be pushed out and lost without the user even knowing it is gone. Also, if the strings are getting longer, it may mean that the IUD is being forced out of the uterus. This means it is no longer offering full protection from pregnancy.   Take any medications your caregiver has ordered, as directed.   Make sure to keep your recheck appointment, so your caregiver can make sure your IUD has remained in place. After that exam, yearly exams are advised, unless you cannot feel the strings of your IUD.   Check that the IUD is still in place by feeling for the strings after  every menstrual period.  SEEK MEDICAL CARE IF:   Bleeding is heavier than a normal menstrual cycle.   You have an oral temperature above 102 F (38.9 C).   You have increasing cramps or pains, not relieved with medicine. Or you develop belly (abdominal) pain that does not seem to be related to the same area of earlier cramping and pain.   You are lightheaded, unusually weak, or have fainting episodes.   You develop pain in the tops of your shoulders (shoulder strap areas).   You are having problems or questions, which have not been answered well enough by your caregiver.   You develop abdominal pain.   You have pain during sexual intercourse.   You cannot feel the IUD strings.   You have abnormal vaginal discharge.   You feel the IUD at the opening of the cervix in the vagina.   You think you are pregnant.   You miss your menstrual period.   The IUD string is hurting your sex partner.   The IUD string has gotten longer.  Document Released: 02/28/2004 Document Revised: 07/11/2010 Document Reviewed: 04/11/2009 [facility] Patient Information 2012 [place], [place].

## 2011-05-11 NOTE — Progress Notes (Signed)
Patient is a 21 year old who presented to the office today due to the fact that she's been amenorrheic since her last menstrual period in December. She's not using any form of contraception. Patient denies any nausea vomiting or breast tenderness. Some mild cramping. Patient is overweight. She is also here for her Pap smear that was repeated because of a previous error in the office when labeling the specimen before was submitted to pathology.  Urine pregnancy test negative  Exam: Abdomen: Soft nontender no rebound or guarding Bartholin urethra Skene glands within normal limits Vagina: No gross lesions on inspection Cervix no lesions or discharge Pap smear repeated Uterus anteverted upper limits of normal Adnexa no palpable masses or tenderness no rebound or guarding Rectal: Not done  Patient is a few days late for her menstrual cycle. Urine pregnancy test today was negative. We'll wait one more week if no. I then she'll repeat a urine pregnancy test at home and followup here in the office. She is interested in discussing different contraceptive options which we did today. She would like to use a nonhormonal contraception. We discussed the ParaGard T380A IUD risks benefits and pros and cons were discussed and literature formation was provided. We'll wait for a period to start in check her insurance verification encouraged and place it sometime next week.

## 2011-05-24 ENCOUNTER — Ambulatory Visit (INDEPENDENT_AMBULATORY_CARE_PROVIDER_SITE_OTHER): Payer: PRIVATE HEALTH INSURANCE | Admitting: Gynecology

## 2011-05-24 ENCOUNTER — Encounter: Payer: Self-pay | Admitting: Gynecology

## 2011-05-24 VITALS — BP 124/78

## 2011-05-24 DIAGNOSIS — R350 Frequency of micturition: Secondary | ICD-10-CM

## 2011-05-24 DIAGNOSIS — R87811 Vaginal high risk human papillomavirus (HPV) DNA test positive: Secondary | ICD-10-CM

## 2011-05-24 DIAGNOSIS — IMO0002 Reserved for concepts with insufficient information to code with codable children: Secondary | ICD-10-CM

## 2011-05-24 LAB — URINALYSIS W MICROSCOPIC + REFLEX CULTURE
Hgb urine dipstick: NEGATIVE
Ketones, ur: NEGATIVE mg/dL
Nitrite: NEGATIVE
Urobilinogen, UA: 0.2 mg/dL (ref 0.0–1.0)

## 2011-05-24 NOTE — Progress Notes (Signed)
Patient presented to the office today for colposcopic evaluation of genitalia as a result of recent pap smear with the following:  ATYPICAL SQUAMOUS CELLS OF UNDETERMINED SIGNIFICANCE (ASC-US). High Risk HPV detected  Physical Exam  Genitourinary:     A detail colposcopic evaluation was undertaken the external genitalia vagina fornices and cervix were inspected after acetic as was applied. The transformation zone was visualized entirely. A small acetowhite area was noted at the 12:00 position which was biopsied along with an ECC. Monsel solution was used for hemostasis. Literature information was provided and the diagnosis and management of abnormal Pap smear the new guidelines for patient to review. Will call when the results become available. Of note patient been complaining a few days of urinary frequency but no dysuria her urinalysis today was essentially negative.

## 2011-05-24 NOTE — Patient Instructions (Addendum)
Cervical cytology: Evaluation of atypical squamous cells (ASC-US and ASC-H)  Authors Lanna Poche, MD Thayer Ohm, MD Section Editor Alvera Novel, MD Deputy Editor Morton Amy, MD Disclosures  All topics are updated as new evidence becomes available and our peer review process is complete.  Literature review current through: Jan 2013.  This topic last updated: Feb 12, 2011.  INTRODUCTION -- On cytologic examination of the cervix, atypical squamous cells (ASC) display cellular abnormalities more marked than simple reactive changes, but not meeting criteria for squamous intraepithelial neoplasia (SIL). Current ASC terminology consists of ASC-US, which is qualified as "of undetermined significance," and ASC-H, in which a high grade squamous intraepithelial lesion (HSIL) cannot be excluded [1]. ASC is the most common abnormal finding during cervical cancer screening: it is reported in about 5 percent of cervical screening tests [2]. Unfortunately, ASC is a less reproducible diagnosis than other cytologic abnormalities [3] and is frequently associated with spontaneously resolving, self-limited disease. The American Society for Colposcopy and Cervical Pathology's (ASCCP) 2006 consensus guideline for the evaluation of women with minimally abnormal cervical cytology (ASC-US and ASC-H) is provided below [4]. These are discussed below and can be found in algorithmic form online at [email or web address].com/. A guideline for routine screening for cervical cancer and interpretation of the cytology report are reviewed separately. (See "Screening for cervical cancer: Rationale and recommendations" and "Cervical cytology: Interpretation of results".) RISK OF CERVICAL INTRAEPITHELIAL NEOPLASIA OR CERVICAL CANCER -- The risk of invasive cervical cancer in women with ASC results is low, but many will require treatment or surveillance for high-grade cervical intraepithelial neoplasia (CIN). A study of  approximately one million cervical cytology specimens reported the following results of cervical biopsy [5]: ASC-US - CIN2 or more severe (15 percent); cervical cancer (0.2 percent); data were reported only for specimens that were positive for high risk subtypes of human papillomavirus infection (HPV)  ASC-H - CIN2+ (38 percent); cervical cancer (2.7 percent); HPV high risk-positive specimens were associated with a higher incidence of CIN2+ than HPV-negative specimens (50 versus 11 percent) or cervical cancer (2.2 verus 1.0 percent) EVALUATION OF ASC-US -- Three reasonable approaches to management of patients with atypical squamous cells of undetermined significance (ASC-US) are described below. (See 'Evaluation strategies' below.) The preferred approach for women age 22 years or older with a cervical cytology finding of ASC-US is to have reflex human papillomavirus (HPV) testing (ie, collecting a specimen for HPV testing when the cytology sample is collected), but performing the HPV test only if the cytologic results are ASC-US. If testing for high risk strains of HPV testing is positive, women should undergo colposcopy [4,6-9]. Reflex testing in adult women saves the patient from the inconvenience of a second visit for HPV testing, is cost effective because this information allows 40 to 60 percent of patients to avoid colposcopy, and potentially decreases the number of patients lost to follow-up [10-12].  Data from the Atypical Squamous Cell of Undetermined Significance/Low-Grade Squamous Intraepithelial Lesions Triage Study (ALTS) Group showed that HPV-reflex testing has the best test performance [7]. In this randomized trial, the sensitivity of the HPV-based approach for detecting CIN 3 or above was significantly higher than with immediate colposcopy (22 versus 54 percent), with a significant reduction in the need for colposcopy referral (56 versus 100 percent). The sensitivity of cytologic evaluation was  55 percent when patients were referred for HSIL or worse. For HPV-reflex testing and cytologic evaluation to have similar sensitivities, two cytology smears would have to be performed  six months apart with colposcopy referral for ASC-US or above; this would result in a 67 percent rate of colposcopy referral. Meta-analyses including subsequent trials have affirmed these findings [8,13]. Among women with initial ASC-US cytology, the HPV reflex testing (using the Hybrid Capture 2 assay) provided significantly higher sensitivity for detection of CIN 2,3 compared with repeat cervical cytology (95 versus 82 percent [8]). Specificity was moderate-to-poor for both approaches (67 and 58 percent, respectively), but was significantly higher in trials in which the mean age of subjects was 22. Sensitivity was not affected by age. In addition, mathematical models have consistently shown that cervical cancer screening using reflex HPV-testing is the most cost-effective screening strategy for women in the [place] diagnosed with ASC-US [10,11,14]. Comparison groups included ignoring ASC as a potentially pathologic finding, immediate colposcopy, repeat cytology, and two visit HPV testing. However, the results of these cost analyses are influenced by the age of the population screened. Although HPV testing performs similarly for the detection of high-grade lesions in all age groups (<25, 25-34, 78-44, 70-54, 51-64, >64 years) [15], the rate of colposcopy referral is significantly lower in women ?22 years of age than in younger women because of the decreasing prevalence of HPV with age (table 1) [16]. A study by the ALTS Group provides large-scale evidence that the risk of prevalent or incipient cervical neoplasia is linked to the HPV type [17]. Of 5060 patients with ASC-US or LSIL who participated in ALTS, almost 70 percent tested positive for at least one HPV type. Of those who tested positive, 44 percent were positive  for a single type, the remainder tested positive for 2 to 12 HPV types. Women infected with HPV 16 alone had a two-year cumulative risk of ?CIN 2 and ?CIN 3 of 50.6 and 39.1 percent, respectively. Other high risk HPV types were associated with a much lower cumulative risk of CIN 2,3 (18.8 and 7.9 percent, respectively). The risk of CIN 3 in women infected with multiple high risk HPV types other than HPV-16 was higher than that in those infected with a single high risk type (10.9 versus 7.9 percent, respectively); but if the infection comprised both high and low risk types, then the risk was similar to that associated with the patient's highest risk type. If cervical cytology is performed in adolescents (defined as ages 24 to 63 according to the ASCCP guidelines), reflex HPV testing may be of little use as a means of triage, since as many as 80 percent may test positive for HPV [18]. Repeat cytology is preferred in this population. [employer] (ACOG) guidelines stress that HPV testing should not be used in patients younger than 21 years, and if inadvertently performed, a positive result should not influence management. (See 'Adolescents' below.) Special circumstances, such as active cervicovaginal infection or atrophy, may defer immediate triage (see 'Special circumstances and populations' below). Evaluation strategies -- For nonadolescent women, there are three acceptable management schemes for evaluation of ASC-US. Options for adolescents are described below (see 'Adolescents' below). Reflex HPV testing -- Reflex HPV testing (also referred to as secondary HPV testing) is the collection a specimen for HPV testing when the cytology sample is collected, but performing the HPV test only if the cytologic results are ASC-US, as noted above. Positive or negative HPV testing results are defined as positive or negative for high-risk subtypes (table 2). HPV testing usually tests  for these subtypes, and not for low-risk HPV subtypes or for a single subtype. (See "  Cervical cancer screening tests: Techniques and test characteristics of cervical cytology and human papillomavirus testing", section on 'HPV testing'.) Based upon guidelines from the American Society for Colposcopy and Cervical Pathology and the American Cancer Society (table 3) [19]:  Positive HPV test -- Women age 29 years or older with a positive HPV test are evaluated with colposcopy. (See "Colposcopy".)  Negative HPV test  Ages 103 to 32 years - Repeat cervical cytology in three years  Ages 99 to 72 years - Screen with cervical cytology and HPV testing in five years  HPV-negative ASC-US is usually due to disturbances in maturation or cellular changes related to inflammation or atrophy [20]. HPV testing instead of cytology at 12 months has been proposed since the specificity of HPV testing is high in this setting [21]; however, the sensitivities of the two tests have not been compared in controlled trials. If HPV testing remains negative at 12 months, these authors have also proposed that the patient return to routine screening intervals, based upon her age and prior screening history. Persistent HPV-negative ASC-US -- Some women have persistent HPV-negative ASC-US. This is most likely due to inflammation or atrophy. Such women can be followed with annual cervical cytology if there are no symptoms of postcoital or abnormal uterine bleeding and if a pelvic examination is normal. Repeat cytology -- Cytologic evaluation can be repeated in 6 and 12 months and, if normal, routine screening may be resumed [4,18]. A second abnormal smear (ASC-US or greater) is evaluated with colposcopy. About 65 percent of women will be referred for colposcopy using this strategy [7]. The rationale for repeated cytologic evaluations is the potential that a significant abnormality will be missed on a single repeat smear (15 to 33 percent) [22],  as well as inter- and intraobserver variability in diagnosis of ASC. The disadvantages of this approach are the need for multiple follow-up visits, potential delay in histological diagnosis, and a lack of data regarding optimum frequency and duration of testing

## 2011-05-25 ENCOUNTER — Ambulatory Visit: Payer: PRIVATE HEALTH INSURANCE | Admitting: Gynecology

## 2011-08-03 ENCOUNTER — Ambulatory Visit (INDEPENDENT_AMBULATORY_CARE_PROVIDER_SITE_OTHER): Payer: PRIVATE HEALTH INSURANCE | Admitting: Gynecology

## 2011-08-03 ENCOUNTER — Encounter: Payer: Self-pay | Admitting: Gynecology

## 2011-08-03 VITALS — BP 128/86

## 2011-08-03 DIAGNOSIS — Z309 Encounter for contraceptive management, unspecified: Secondary | ICD-10-CM

## 2011-08-03 NOTE — Progress Notes (Signed)
Patient is a 21 year old who presented to the office today to discuss contraceptive options. She states she has normal menstrual cycles and had been on the pill in the past (Yaz) but is concerned about her weight gain and remember to take a pill every day. She was seen in the office on February 14 for colposcopic evaluation as a result of her recent Pap smear that demonstrated atypical squamous cells of undetermined significance with high risk HPV. 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 - BENIGN ENDOCERVICAL MUCOSA. 2. Cervix, biopsy, C&B 12 o'clock - LOW GRADE SQUAMOUS INTRAEPITHELIAL LESION, CIN-I (MILD DYSPLASIA). - SEE COMMENT. Microscopic Comment 2. The biopsy findings correlates with Pap smear demonstrating ASCUS with high risk HPV detected  We discussed the new Pap smear and dysplasia guidelines and she was informed to return back in 6 months for followup Pap smear which is due this August.  We had a detailed discussion of all the different contraceptive options. Since she is overweight and is concerned with this she would like a form of contraception that is nonhormonal and that she would not have to remember to take every day. She is leaning towards the ParaGard T380A IUD. Literature information was provided the risk benefits and pros and cons were discussed. She will check with her insurance company for verification and coverage and schedule one to be placed during her next menstrual period.

## 2011-08-03 NOTE — Patient Instructions (Signed)
Intrauterine Device Information An intrauterine device (IUD) is inserted into your uterus and prevents pregnancy. There are 2 types of IUDs available:  Copper IUD. This type of IUD is wrapped in copper wire and is placed inside the uterus. Copper makes the uterus and fallopian tubes produce a fluid that kills sperm. The copper IUD can stay in place for 10 years.   Hormone IUD. This type of IUD contains the hormone progestin (synthetic progesterone). The hormone thickens the cervical mucus and prevents sperm from entering the uterus, and it also thins the uterine lining to prevent implantation of a fertilized egg. The hormone can weaken or kill the sperm that get into the uterus. The hormone IUD can stay in place for 5 years.  Your caregiver will make sure you are a good candidate for a contraceptive IUD. Discuss with your caregiver the possible side effects. ADVANTAGES  It is highly effective, reversible, long-acting, and low maintenance.   There are no estrogen-related side effects.   An IUD can be used when breastfeeding.   It is not associated with weight gain.   It works immediately after insertion.   The copper IUD does not interfere with your female hormones.   The progesterone IUD can make heavy menstrual periods lighter.   The progesterone IUD can be used for 5 years.   The copper IUD can be used for 10 years.  DISADVANTAGES  The progesterone IUD can be associated with irregular bleeding patterns.   The copper IUD can make your menstrual flow heavier and more painful.   You may experience cramping and vaginal bleeding after insertion.  Document Released: 02/28/2004 Document Revised: 03/15/2011 Document Reviewed: 07/29/2010 [employer] Patient Information 2012 [employer]. 

## 2012-06-26 ENCOUNTER — Encounter: Payer: PRIVATE HEALTH INSURANCE | Admitting: Gynecology

## 2012-08-04 IMAGING — US US ABDOMEN COMPLETE
1 series · 14 of 25 positions shown · non-contrast
Comparison: None.

CLINICAL DATA: Abdominal pain

ABDOMINAL ULTRASOUND COMPLETE

[Series 1: us abdomen complete · 0.32mm/px · 14 of 60 slices shown]
[im 1/60]
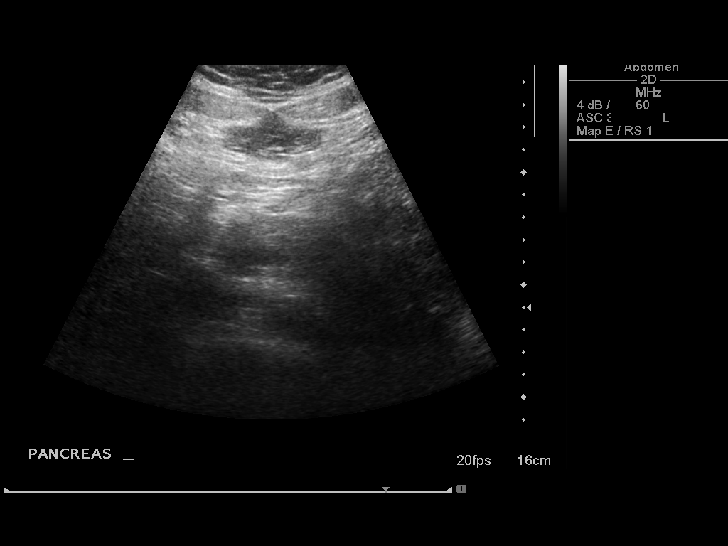
[im 5/60]
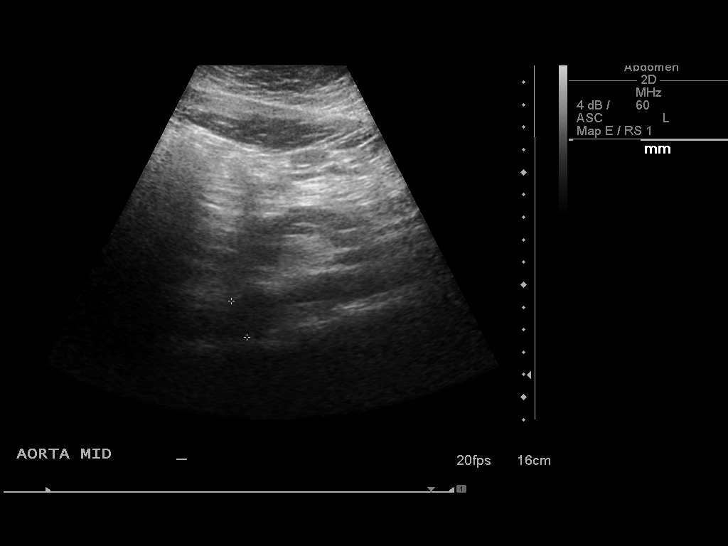
[im 10/60]
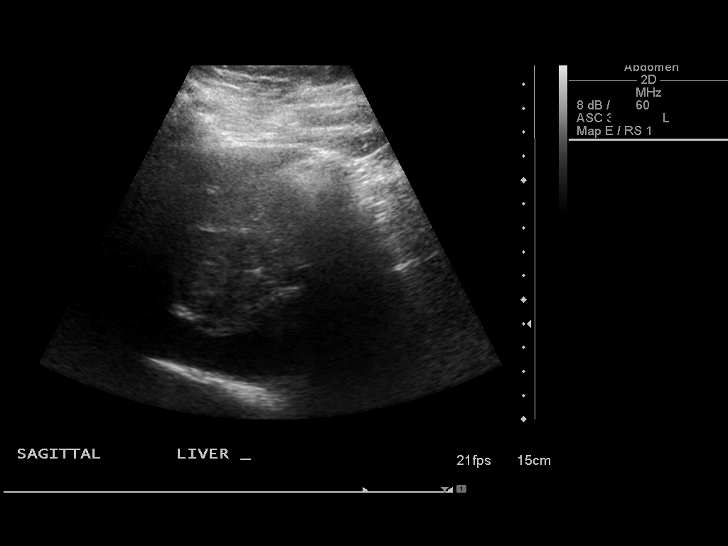
[im 15/60]
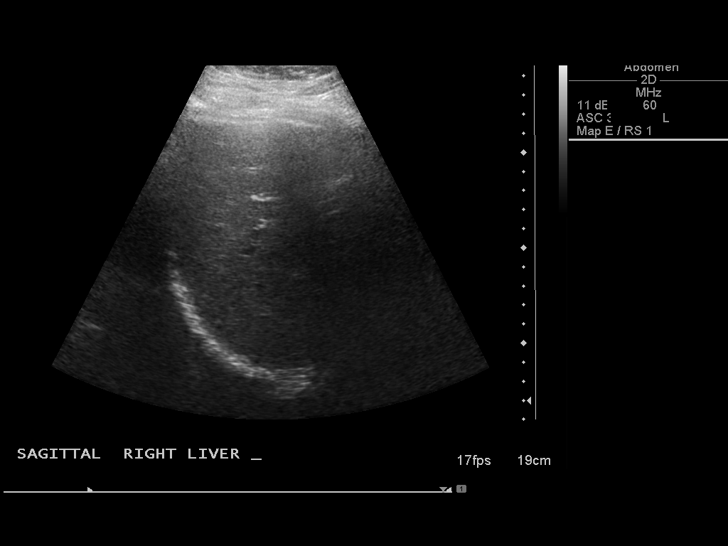
[im 20/60]
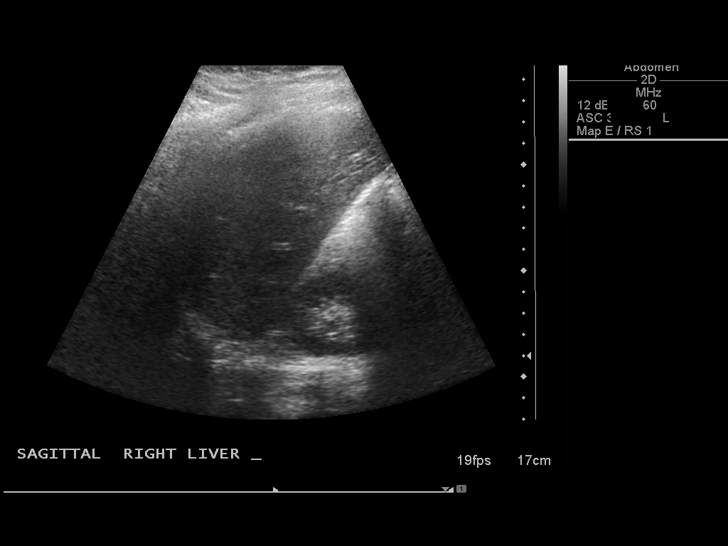
[im 23/60]
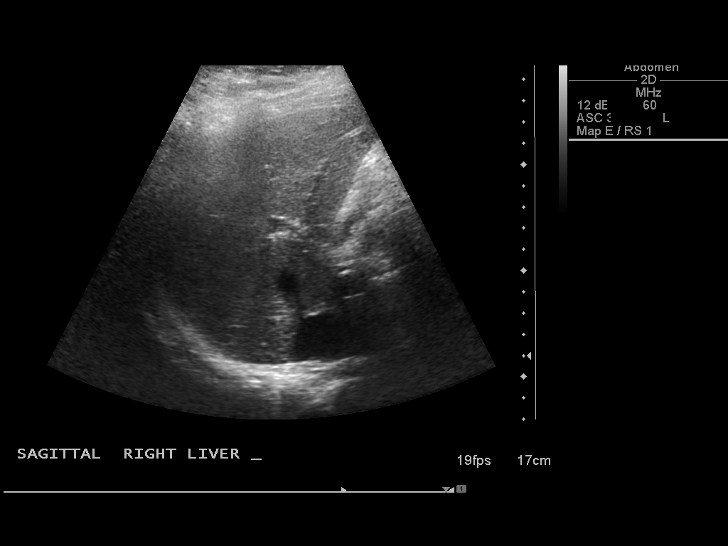
[im 28/60]
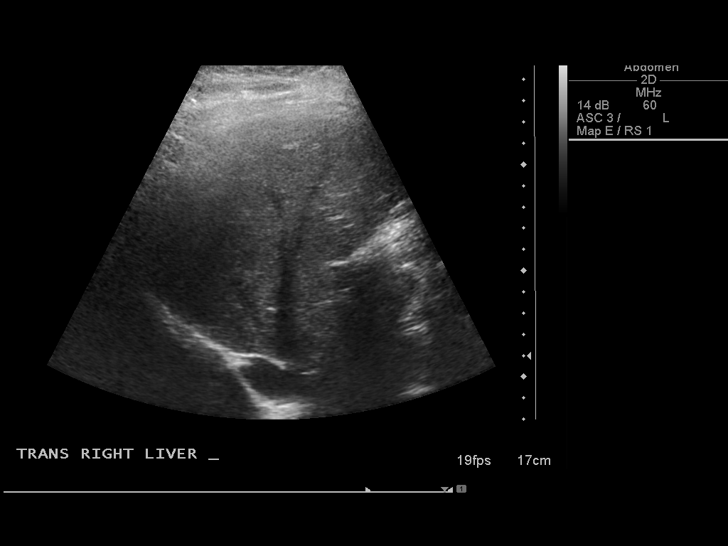
[im 32/60]
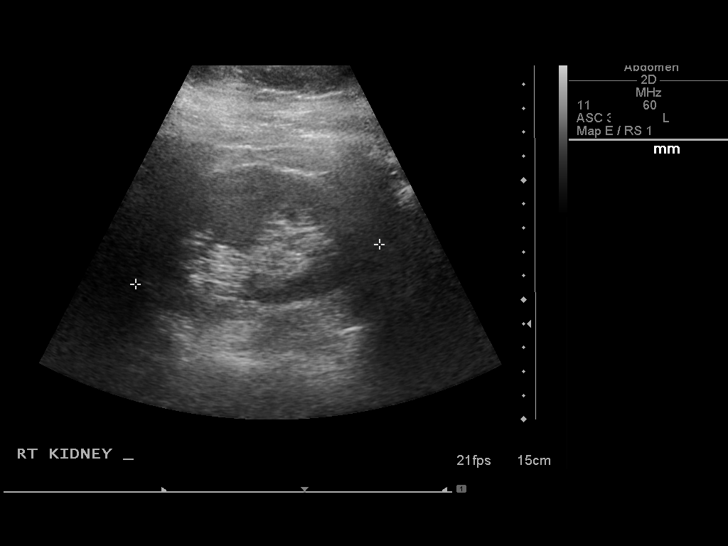
[im 37/60]
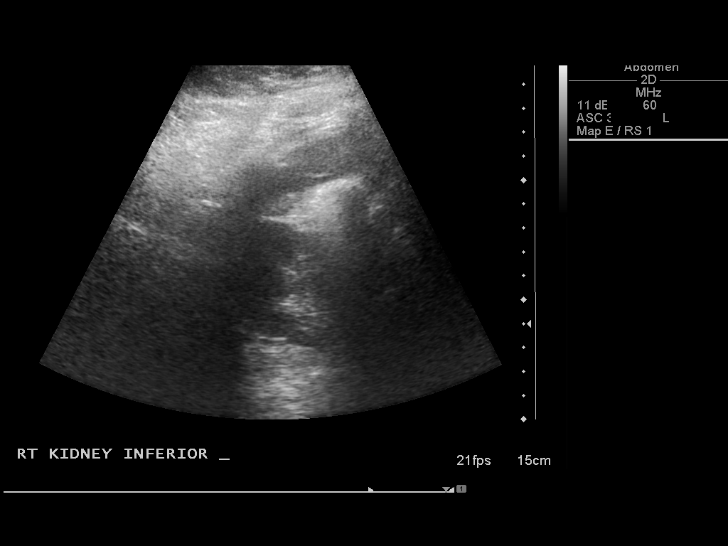
[im 40/60]
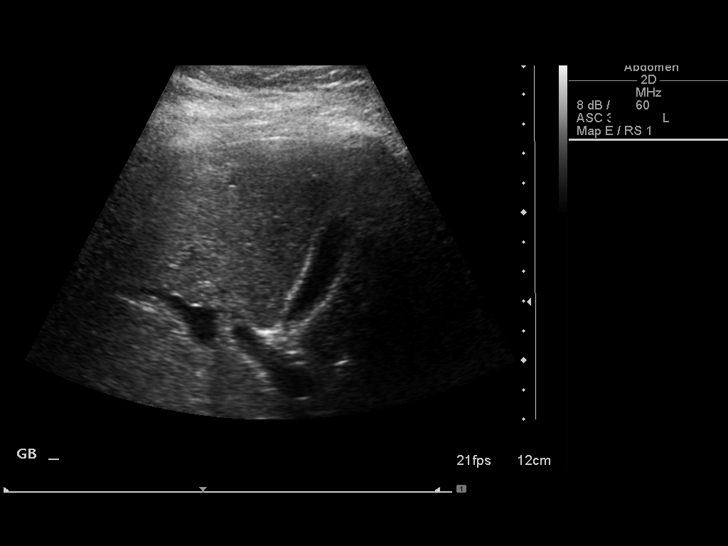
[im 45/60]
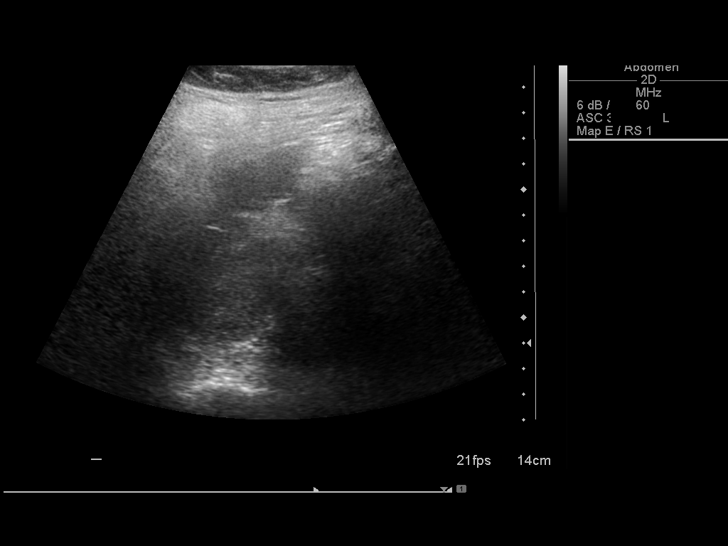
[im 50/60]
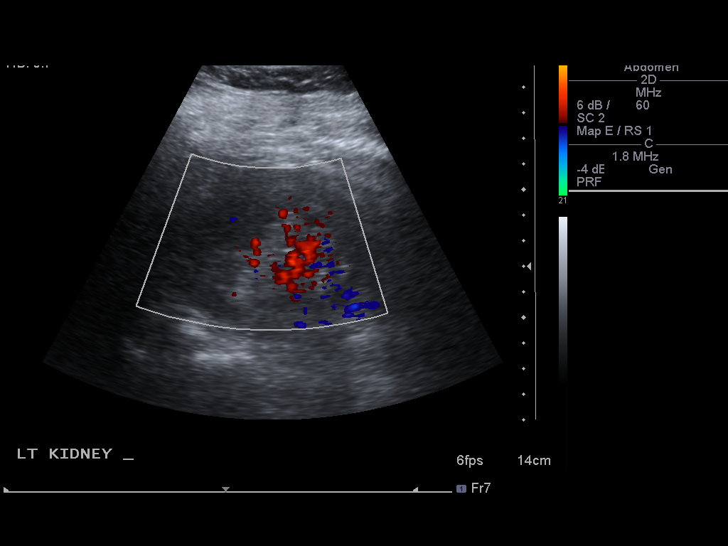
[im 55/60]
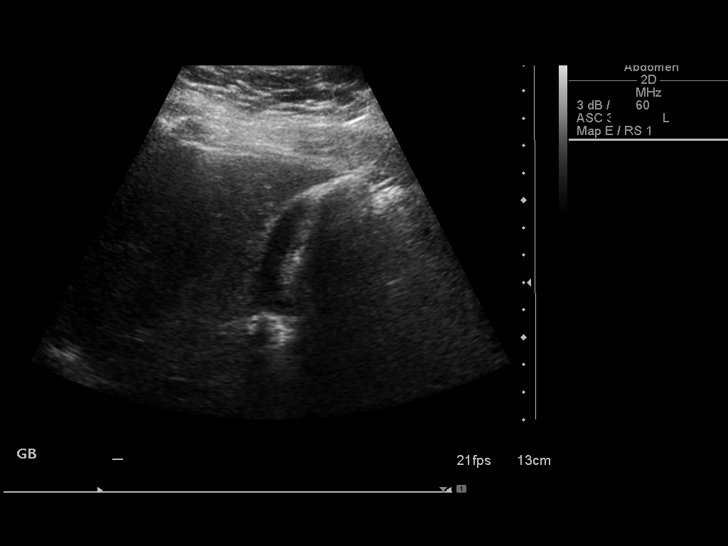
[im 60/60]
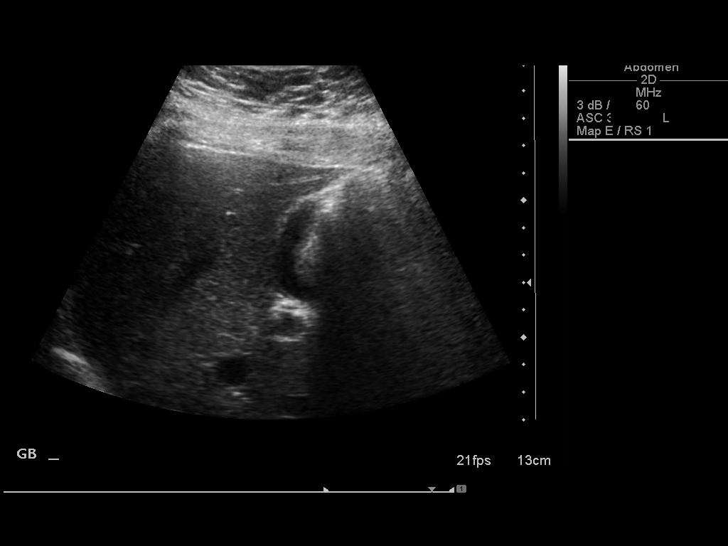

[14 of 25 positions shown; findings below may reference images not displayed]

FINDINGS: Gallbladder:  No gallstones, gallbladder wall thickening, or
pericholecystic fluid.

Common Bile Duct:  Within normal limits in caliber.

Liver: Mildly inhomogeneous echotexture without focal abnormality.
No intrahepatic ductal dilatation.

IVC:  Appears normal.

Pancreas:  Obscured by bowel gas.

Spleen:  Within normal limits in size and echotexture.

Right kidney:  Normal in size and parenchymal echogenicity.  No
evidence of mass or hydronephrosis.

Left kidney:  Normal in size and parenchymal echogenicity.  No
evidence of mass or hydronephrosis.

Abdominal Aorta:  Obscured by bowel gas.
IMPRESSION: Negative abdominal ultrasound.

## 2017-10-01 ENCOUNTER — Other Ambulatory Visit: Payer: Self-pay

## 2017-10-01 ENCOUNTER — Emergency Department (HOSPITAL_COMMUNITY)
Admission: EM | Admit: 2017-10-01 | Discharge: 2017-10-02 | Disposition: A | Discharge status: Home / Self Care | Payer: BC Managed Care – PPO | Attending: Emergency Medicine | Admitting: Emergency Medicine

## 2017-10-01 DIAGNOSIS — R1013 Epigastric pain: Secondary | ICD-10-CM | POA: Insufficient documentation

## 2017-10-01 DIAGNOSIS — K279 Peptic ulcer, site unspecified, unspecified as acute or chronic, without hemorrhage or perforation: Secondary | ICD-10-CM

## 2017-10-01 DIAGNOSIS — R51 Headache: Secondary | ICD-10-CM | POA: Diagnosis present

## 2017-10-02 ENCOUNTER — Emergency Department (HOSPITAL_COMMUNITY): Payer: BC Managed Care – PPO

## 2017-10-02 ENCOUNTER — Encounter (HOSPITAL_COMMUNITY): Payer: Self-pay | Admitting: Emergency Medicine

## 2017-10-02 ENCOUNTER — Other Ambulatory Visit: Payer: Self-pay

## 2017-10-02 ENCOUNTER — Emergency Department (HOSPITAL_COMMUNITY)
Admission: EM | Admit: 2017-10-02 | Discharge: 2017-10-02 | Disposition: A | Discharge status: Home / Self Care | Payer: BC Managed Care – PPO | Source: Home / Self Care | Attending: Emergency Medicine | Admitting: Emergency Medicine

## 2017-10-02 DIAGNOSIS — Z79899 Other long term (current) drug therapy: Secondary | ICD-10-CM

## 2017-10-02 DIAGNOSIS — R1013 Epigastric pain: Secondary | ICD-10-CM | POA: Insufficient documentation

## 2017-10-02 LAB — COMPREHENSIVE METABOLIC PANEL
ALT: 18 U/L (ref 0–44)
ANION GAP: 6 (ref 5–15)
AST: 15 U/L (ref 15–41)
Albumin: 3.3 g/dL — ABNORMAL LOW (ref 3.5–5.0)
Alkaline Phosphatase: 71 U/L (ref 38–126)
BUN: 9 mg/dL (ref 6–20)
CHLORIDE: 109 mmol/L (ref 98–111)
CO2: 24 mmol/L (ref 22–32)
Calcium: 8.6 mg/dL — ABNORMAL LOW (ref 8.9–10.3)
Creatinine, Ser: 0.93 mg/dL (ref 0.44–1.00)
GFR calc Af Amer: >60 mL/min (ref >60)
GFR calc non Af Amer: >60 mL/min (ref >60)
GLUCOSE: 105 mg/dL — AB (ref 70–99)
Potassium: 3.5 mmol/L (ref 3.5–5.1)
SODIUM: 139 mmol/L (ref 135–145)
TOTAL PROTEIN: 6.5 g/dL (ref 6.5–8.1)
Total Bilirubin: 0.6 mg/dL (ref 0.3–1.2)

## 2017-10-02 LAB — CBC WITH DIFFERENTIAL/PLATELET
ABS IMMATURE GRANULOCYTES: 0 10*3/uL (ref 0.0–0.1)
BASOS ABS: 0 10*3/uL (ref 0.0–0.1)
BASOS PCT: 1 %
Eosinophils Absolute: 0.1 10*3/uL (ref 0.0–0.7)
Eosinophils Relative: 1 %
HCT: 34.1 % — ABNORMAL LOW (ref 36.0–46.0)
HEMOGLOBIN: 10.8 g/dL — AB (ref 12.0–15.0)
Immature Granulocytes: 0 %
LYMPHS PCT: 39 %
Lymphs Abs: 2.8 10*3/uL (ref 0.7–4.0)
MCH: 27.4 pg (ref 26.0–34.0)
MCHC: 31.7 g/dL (ref 30.0–36.0)
MCV: 86.5 fL (ref 78.0–100.0)
MONOS PCT: 6 %
Monocytes Absolute: 0.4 10*3/uL (ref 0.1–1.0)
NEUTROS ABS: 3.8 10*3/uL (ref 1.7–7.7)
NEUTROS PCT: 53 %
PLATELETS: 320 10*3/uL (ref 150–400)
RBC: 3.94 MIL/uL (ref 3.87–5.11)
RDW: 14.4 % (ref 11.5–15.5)
WBC: 7.1 10*3/uL (ref 4.0–10.5)

## 2017-10-02 LAB — LIPASE, BLOOD: LIPASE: 26 U/L (ref 11–51)

## 2017-10-02 LAB — POC URINE PREG, ED: PREG TEST UR: NEGATIVE

## 2017-10-02 MED ORDER — GI COCKTAIL ~~LOC~~
30.0000 mL | Freq: Once | ORAL | Status: AC
  Administered 2017-10-02: 30 mL via ORAL
  Filled 2017-10-02: qty 30

## 2017-10-02 MED ORDER — RANITIDINE HCL 150 MG PO CAPS: 150.0000 mg | ORAL_CAPSULE | Freq: Every day | ORAL | 0 refills | Status: AC

## 2017-10-02 MED ORDER — SUCRALFATE 1 G PO TABS: 1.0000 g | ORAL_TABLET | Freq: Three times a day (TID) | ORAL | 0 refills | Status: AC

## 2017-10-02 MED ORDER — OMEPRAZOLE 20 MG PO CPDR: 20.0000 mg | DELAYED_RELEASE_CAPSULE | Freq: Every day | ORAL | 0 refills | Status: AC

## 2017-10-02 MED ORDER — DICYCLOMINE HCL 20 MG PO TABS: 20.0000 mg | ORAL_TABLET | Freq: Two times a day (BID) | ORAL | 0 refills | Status: AC

## 2017-10-02 NOTE — ED Triage Notes (Signed)
Patient seen yesterday in ED for headache and epigastric burning. States that she was discharged this am and continuing to have burning. No vomiting, NAD

## 2017-10-02 NOTE — ED Provider Notes (Signed)
[facility] EMERGENCY DEPARTMENT Provider Note   CSN: [id number] Arrival date & time: 10/02/17  1403     History   Chief Complaint Chief Complaint  Patient presents with  . abdominal burning    HPI Cathy Ayers is a 27 y.o. female.  HPI  Patient is a 27 year old female with a history of IBS presenting for dull and achy epigastric periumbilical pain.  Patient reports that she began experiencing the symptoms throughout this week, and feels that it may be related to her IBS.  Patient reports that she presented the emergency department yesterday evening for headache and epigastric discomfort, which was somewhat relieved by the GI cocktail provided to her, but she reports that the omeprazole that she was prescribed has not worked.  Patient reports nausea without vomiting.  Patient reports she has had frequent soft stools, consistent with her previous IBS symptoms, but denies any melena or hematochezia.  Patient denies any urinary urgency, frequency, dysuria, or vaginal discharge or bleeding.  Patient does report that she has a history of endoscopy, and treatment for gastric irritation, but no history of ulcers that she is aware of.  No past medical history on file.  There are no active problems to display for this patient.   No past surgical history on file.   OB History    Gravida  0   Para      Term      Preterm      AB      Living        SAB      TAB      Ectopic      Multiple      Live Births               Home Medications    Prior to Admission medications   Medication Sig Start Date End Date Taking? Authorizing Provider  Aspirin-Acetaminophen-Caffeine (EXCEDRIN EXTRA STRENGTH PO) Take 2 tablets by mouth daily.    [provider]  dicyclomine (BENTYL) 20 MG tablet Take 1 tablet (20 mg total) by mouth 2 (two) times daily. 10/02/17   Aviva Kluver B, PA-C  omeprazole (PRILOSEC) 20 MG capsule Take 1 capsule (20 mg total) by  mouth daily. 10/02/17   Derwood Kaplan, MD  ranitidine (ZANTAC) 150 MG capsule Take 1 capsule (150 mg total) by mouth daily for 14 days. 10/02/17 10/16/17  Aviva Kluver B, PA-C  sucralfate (CARAFATE) 1 g tablet Take 1 tablet (1 g total) by mouth 4 (four) times daily -  with meals and at bedtime for 7 days. 10/02/17 10/09/17  Elisha Ponder, PA-C    Family History Family History  Problem Relation Age of Onset  . Cancer Maternal Grandmother        LUNG  . Cancer Paternal Grandmother        BRAIN    Social History Social History   Tobacco Use  . Smoking status: Never Smoker  . Smokeless tobacco: Never Used  Substance Use Topics  . Alcohol use: Yes    Comment: OCC  . Drug use: No     Allergies   Ciprofloxacin   Review of Systems Review of Systems  Constitutional: Negative for chills and fever.  Gastrointestinal: Positive for abdominal pain and nausea. Negative for abdominal distention, blood in stool and vomiting.  Genitourinary: Negative for dysuria, frequency, pelvic pain, urgency, vaginal bleeding and vaginal discharge.  All other systems reviewed and are negative.    Physical  Exam Updated Vital Signs BP 108/78   Pulse 74   Temp 97.8 F (36.6 C) (Oral)   Resp 18   SpO2 99%   Physical Exam  Constitutional: She appears well-developed and well-nourished. No distress.  HENT:  Head: Normocephalic and atraumatic.  Mouth/Throat: Oropharynx is clear and moist.  Eyes: Pupils are equal, round, and reactive to light. Conjunctivae and EOM are normal.  Neck: Normal range of motion. Neck supple.  Cardiovascular: Normal rate, regular rhythm, S1 normal and S2 normal.  No murmur heard. Pulmonary/Chest: Effort normal and breath sounds normal. She has no wheezes. She has no rales.  Abdominal: Soft. She exhibits no distension. There is tenderness. There is no guarding.  Patient has epigastric to periumbilical discomfort to palpation.  No guarding or rebound.  Musculoskeletal:  Normal range of motion. She exhibits no edema or deformity.  Lymphadenopathy:    She has no cervical adenopathy.  Neurological: She is alert.  Cranial nerves grossly intact. Patient moves extremities symmetrically and with good coordination.  Skin: Skin is warm and dry. No rash noted. No erythema.  Psychiatric: She has a normal mood and affect. Her behavior is normal. Judgment and thought content normal.  Nursing note and vitals reviewed.    ED Treatments / Results  Labs (all labs ordered are listed, but only abnormal results are displayed) Labs Reviewed  POC URINE PREG, ED    EKG None  Radiology [place] Abdomen Limited  Result Date: 10/02/2017 CLINICAL DATA:  27 year old female with right upper quadrant abdominal pain. EXAM: ULTRASOUND ABDOMEN LIMITED RIGHT UPPER QUADRANT COMPARISON:  None. FINDINGS: Gallbladder: No gallstones or wall thickening visualized. No sonographic Murphy sign noted by sonographer. Common bile duct: Diameter: 3 mm Liver: There is mild diffuse increased liver echogenicity most consistent with fatty infiltration. Portal vein is patent on color Doppler imaging with normal direction of blood flow towards the liver. IMPRESSION: 1. No gallstone. 2. Fatty liver. Electronically Signed   By: Elgie CollardArash  Radparvar M.D.   On: 10/02/2017 04:31    Procedures Procedures (including critical care time)  Medications Ordered in ED Medications  gi cocktail (Maalox,Lidocaine,Donnatal) (30 mLs Oral Given 10/02/17 1426)     Initial Impression / Assessment and Plan / ED Course  I have reviewed the triage vital signs and the nursing notes.  Pertinent labs & imaging results that were available during my care of the patient were reviewed by me and considered in my medical decision making (see chart for details).     Patient nontoxic-appearing, afebrile, with benign abdomen.  Patient reports symptoms consistent with peptic ulcer disease.  Symptoms were improved by GI cocktail  prescribed today.  No further concerning features on exam or in review of workup from previous visit.  Will prescribe additional medication to assist with symptoms until omeprazole therapy has taken effect.  Patient to follow-up with primary care, and discussed that she is in the process of [place] in [place].  Patient given return precautions for any abdominal pain with fevers, worsening abdominal pain, or intractable nausea vomiting.  Patient is in understanding and agrees with the plan of care.  Final Clinical Impressions(s) / ED Diagnoses   Final diagnoses:  Epigastric pain    ED Discharge Orders        Ordered    sucralfate (CARAFATE) 1 g tablet  3 times daily with meals & bedtime     10/02/17 1658    dicyclomine (BENTYL) 20 MG tablet  2 times daily  10/02/17 1658    ranitidine (ZANTAC) 150 MG capsule  Daily     10/02/17 1658       Delia Chimes 10/02/17 1759    Rolland Porter, MD 10/04/17 [phone]

## 2017-10-02 NOTE — Discharge Instructions (Addendum)
We saw you in the ER for abdominal pain, that could be because of acid reflux or stomach ulcers.  Please see your primary care doctor in 2 weeks. Emergency room results do not reveal any evidence of gallstones.

## 2017-10-02 NOTE — Discharge Instructions (Addendum)
Please see the information and instructions below regarding your visit.  Your diagnoses today include:  1. Epigastric pain    Your ultrasound yesterday showed fatty liver.  This this generally does not cause pain, but not because of your symptoms.  Please help with your primary care provider regarding this finding.  Tests performed today include: See side panel of your discharge paperwork for testing performed today. Vital signs are listed at the bottom of these instructions.   Medications prescribed:    Take any prescribed medications only as prescribed, and any over the counter medications only as directed on the packaging.  You are prescribed Carafate.  This medication will coat the lining of the stomach to prevent irritation.  This can be taken up to 4 times a day.  You are prescribed Bentyl.  This is an antispasmodic agent.    You are prescribed Zantac.  This is an antacid medication.  Please take this overlapping with omeprazole for 14 days.   Home care instructions:  Please follow any educational materials contained in this packet.   Follow-up instructions: Please follow-up with your primary care provider as soon as possible for further evaluation of your symptoms if they are not completely improved.   Return instructions:  Please return to the Emergency Department if you experience worsening symptoms.  Please return to the emergency department if you develop any worsening pain, nausea or vomiting that prevents you from keeping anything down, abdominal pain with fevers. Please return if you have any other emergent concerns.  Additional Information:   Your vital signs today were: BP 108/78    Pulse 74    Temp 97.8 F (36.6 C) (Oral)    Resp 18    SpO2 99%  If your blood pressure (BP) was elevated on multiple readings during this visit above 130 for the top number or above 80 for the bottom number, please have this repeated by your primary care provider within one  month. --------------  Thank you for allowing us to participate in your care today.

## 2017-10-02 NOTE — ED Provider Notes (Signed)
Patient placed in Quick Look pathway, seen and evaluated   Chief Complaint: burning epigastric pain   HPI:   Patient here for stomach pain. Just since yeasterday. No meds at discharge.   ROS: belly pain  (one)  Physical Exam:   Gen: No distress  Neuro: Awake and Alert  Skin: Warm    Focused Exam: epigastric tenderness   Initiation of care has begun. The patient has been counseled on the process, plan, and necessity for staying for the completion/evaluation, and the remainder of the medical screening examination    Arthor CaptainHarris, Deneane Stifter, PA-C 10/02/17 1423    Margarita Grizzleay, Danielle, MD 10/02/17 1651

## 2017-10-02 NOTE — ED Provider Notes (Addendum)
[facility] EMERGENCY DEPARTMENT Provider Note   CSN: [id number] Arrival date & time: 10/01/17  2355     History   Chief Complaint Chief Complaint  Patient presents with  . Headache  . Nausea    HPI Cathy Ayers is a 27 y.o. female.  HPI Pt comes in with cc of headaches. Pain is L lateral and started 2 days ago. Pt has hx of migraine and took excedrin prior to ED arrival, and the headache has now resolved.  Pt also complains of nausea and abd pain. Patient has no pain with urination, blood in the urine, or frequent urination. Pt also denies any vaginal discharge or bleeding. Abdominal pain is located in the epigastric and right upper quadrant, and it is worse with food intake.  Patient has IBS, but states that she has no history of similar pain in the past.   History reviewed. No pertinent past medical history.  There are no active problems to display for this patient.   History reviewed. No pertinent surgical history.   OB History    Gravida  0   Para      Term      Preterm      AB      Living        SAB      TAB      Ectopic      Multiple      Live Births               Home Medications    Prior to Admission medications   Medication Sig Start Date End Date Taking? Authorizing Provider  Aspirin-Acetaminophen-Caffeine (EXCEDRIN EXTRA STRENGTH PO) Take 2 tablets by mouth daily.   Yes [provider]  omeprazole (PRILOSEC) 20 MG capsule Take 1 capsule (20 mg total) by mouth daily. 10/02/17   Derwood Kaplan, MD    Family History Family History  Problem Relation Age of Onset  . Cancer Maternal Grandmother        LUNG  . Cancer Paternal Grandmother        BRAIN    Social History Social History   Tobacco Use  . Smoking status: Never Smoker  . Smokeless tobacco: Never Used  Substance Use Topics  . Alcohol use: Yes    Comment: OCC  . Drug use: No     Allergies   Ciprofloxacin   Review of  Systems Review of Systems  Constitutional: Positive for activity change.  Respiratory: Negative for shortness of breath.   Cardiovascular: Negative for chest pain.  Gastrointestinal: Positive for abdominal pain. Negative for nausea and vomiting.  Genitourinary: Negative for dysuria, flank pain and hematuria.  Neurological: Positive for headaches.  All other systems reviewed and are negative.    Physical Exam Updated Vital Signs BP 107/60   Pulse 67   Temp 98.2 F (36.8 C) (Oral)   Resp 20   SpO2 98%   Physical Exam  Constitutional: She is oriented to person, place, and time. She appears well-developed.  HENT:  Head: Normocephalic and atraumatic.  Eyes: EOM are normal.  Neck: Normal range of motion. Neck supple.  Cardiovascular: Normal rate.  Pulmonary/Chest: Effort normal.  Abdominal: Soft. Bowel sounds are normal. There is tenderness. There is guarding.  Patient has tenderness in the epigastric and right upper quadrant   Neurological: She is alert and oriented to person, place, and time.  Skin: Skin is warm and dry.  Nursing note and vitals reviewed.  ED Treatments / Results  Labs (all labs ordered are listed, but only abnormal results are displayed) Labs Reviewed  COMPREHENSIVE METABOLIC PANEL - Abnormal; Notable for the following components:      Result Value   Glucose, Bld 105 (*)    Calcium 8.6 (*)    Albumin 3.3 (*)    All other components within normal limits  CBC WITH DIFFERENTIAL/PLATELET - Abnormal; Notable for the following components:   Hemoglobin 10.8 (*)    HCT 34.1 (*)    All other components within normal limits  LIPASE, BLOOD    EKG None  Radiology [place] Abdomen Limited  Result Date: 10/02/2017 CLINICAL DATA:  27 year old female with right upper quadrant abdominal pain. EXAM: ULTRASOUND ABDOMEN LIMITED RIGHT UPPER QUADRANT COMPARISON:  None. FINDINGS: Gallbladder: No gallstones or wall thickening visualized. No sonographic Murphy sign noted  by sonographer. Common bile duct: Diameter: 3 mm Liver: There is mild diffuse increased liver echogenicity most consistent with fatty infiltration. Portal vein is patent on color Doppler imaging with normal direction of blood flow towards the liver. IMPRESSION: 1. No gallstone. 2. Fatty liver. Electronically Signed   By: Elgie CollardArash  Radparvar M.D.   On: 10/02/2017 04:31    Procedures Procedures (including critical care time)  Medications Ordered in ED Medications  gi cocktail (Maalox,Lidocaine,Donnatal) (30 mLs Oral Given 10/02/17 0230)     Initial Impression / Assessment and Plan / ED Course  I have reviewed the triage vital signs and the nursing notes.  Pertinent labs & imaging results that were available during my care of the patient were reviewed by me and considered in my medical decision making (see chart for details).  Clinical Course as of Oct 02 500  Wed Oct 02, 2017  0502 Results from the ER workup discussed with the patient face to face and all questions answered to the best of my ability.   US Abdomen Limited [AN]    Clinical Course User Index [AN] Derwood KaplanNanavati, Macy Polio, MD   27 year old female comes in with chief complaint of headache and abdominal pain with nausea.  Patient's headache had already resolved by the time I saw her, and she wanted us to concentrate on her abdominal pain.  Patient's abdominal pain is described as epigastric and burning in nature.  She has history of IBS.  She also reports that the pain is worse with food intake.  Differential diagnosis includes gastritis, GERD, pancreatitis, peptic ulcer disease, cholelithiasis.  Appropriate labs have been ordered.  Final Clinical Impressions(s) / ED Diagnoses   Final diagnoses:  PUD (peptic ulcer disease)  Epigastric pain    ED Discharge Orders        Ordered    omeprazole (PRILOSEC) 20 MG capsule  Daily     10/02/17 0501       Derwood KaplanNanavati, Cayde Held, MD 10/02/17 [id number]    Derwood KaplanNanavati, Lowry Bala, MD 10/02/17 0502

## 2017-10-02 NOTE — ED Triage Notes (Signed)
Pt states she has had a migraine since yesterday and has had nausea since then. States the headache has eased off but the "burning" in her stomach remains. Hx of IBS per pt.

## 2018-03-03 ENCOUNTER — Ambulatory Visit: Payer: BC Managed Care – PPO | Admitting: Family

## 2018-03-09 ENCOUNTER — Emergency Department (HOSPITAL_COMMUNITY)
Admission: EM | Admit: 2018-03-09 | Discharge: 2018-03-09 | Disposition: A | Discharge status: Home / Self Care | Payer: BC Managed Care – PPO | Attending: Emergency Medicine | Admitting: Emergency Medicine

## 2018-03-09 ENCOUNTER — Encounter (HOSPITAL_COMMUNITY): Payer: Self-pay

## 2018-03-09 DIAGNOSIS — J069 Acute upper respiratory infection, unspecified: Secondary | ICD-10-CM | POA: Diagnosis not present

## 2018-03-09 DIAGNOSIS — R05 Cough: Secondary | ICD-10-CM | POA: Insufficient documentation

## 2018-03-09 DIAGNOSIS — J029 Acute pharyngitis, unspecified: Secondary | ICD-10-CM | POA: Diagnosis present

## 2018-03-09 DIAGNOSIS — B9789 Other viral agents as the cause of diseases classified elsewhere: Secondary | ICD-10-CM

## 2018-03-09 HISTORY — DX: Irritable bowel syndrome without diarrhea: K58.9

## 2018-03-09 MED ORDER — BENZONATATE 100 MG PO CAPS: 100.0000 mg | ORAL_CAPSULE | Freq: Three times a day (TID) | ORAL | 0 refills | Status: AC

## 2018-03-09 MED ORDER — ACETAMINOPHEN 325 MG PO TABS
650.0000 mg | ORAL_TABLET | Freq: Once | ORAL | Status: AC
  Administered 2018-03-09: 650 mg via ORAL
  Filled 2018-03-09: qty 2

## 2018-03-09 NOTE — ED Provider Notes (Signed)
[facility] Provider Note   CSN: [id number] Arrival date & time: 03/09/18  1123     History   Chief Complaint Chief Complaint  Patient presents with  . Sore Throat    HPI Herberta Shona SimpsonLargent is a 27 y.o. female presents with a constant sore throat onset 4 days ago. Patient describes pain as a burning. Patient reports she had strep throat in the past, but states she had her tonsils removed and has not had strep throat since then. Patient states she has associated bilateral ear pain, subjective fever, rhinorrhea, productive cough, diarrhea, and congestion. Patient reports she has tried cold and flu OTC without relief. Patient denies abdominal pain, nausea, or vomiting. Patient states she has had sick exposures at work.   HPI  Past Medical History:  Diagnosis Date  . IBS (irritable bowel syndrome)     There are no active problems to display for this patient.   Past Surgical History:  Procedure Laterality Date  . TONSILLECTOMY  2007     OB History    Gravida  0   Para      Term      Preterm      AB      Living        SAB      TAB      Ectopic      Multiple      Live Births               Home Medications    Prior to Admission medications   Medication Sig Start Date End Date Taking? Authorizing Provider  Aspirin-Acetaminophen-Caffeine (EXCEDRIN EXTRA STRENGTH PO) Take 2 tablets by mouth daily.    [provider]  benzonatate (TESSALON) 100 MG capsule Take 1 capsule (100 mg total) by mouth every 8 (eight) hours. 03/09/18   Carlyle BasquesHernandez, Gianelle Mccaul P, PA-C  dicyclomine (BENTYL) 20 MG tablet Take 1 tablet (20 mg total) by mouth 2 (two) times daily. 10/02/17   Aviva KluverMurray, Alyssa B, PA-C  omeprazole (PRILOSEC) 20 MG capsule Take 1 capsule (20 mg total) by mouth daily. 10/02/17   Derwood KaplanNanavati, Ankit, MD  ranitidine (ZANTAC) 150 MG capsule Take 1 capsule (150 mg total) by mouth daily for 14 days. 10/02/17 10/16/17  Aviva KluverMurray, Alyssa B, PA-C   sucralfate (CARAFATE) 1 g tablet Take 1 tablet (1 g total) by mouth 4 (four) times daily -  with meals and at bedtime for 7 days. 10/02/17 10/09/17  Elisha PonderMurray, Alyssa B, PA-C    Family History Family History  Problem Relation Age of Onset  . Cancer Maternal Grandmother        LUNG  . Cancer Paternal Grandmother        BRAIN    Social History Social History   Tobacco Use  . Smoking status: Never Smoker  . Smokeless tobacco: Never Used  Substance Use Topics  . Alcohol use: Yes    Comment: OCC  . Drug use: No     Allergies   Ciprofloxacin   Review of Systems Review of Systems  Constitutional: Positive for fever. Negative for activity change, appetite change and fatigue.  HENT: Positive for congestion, ear pain, rhinorrhea and sore throat. Negative for ear discharge, postnasal drip and trouble swallowing.   Eyes: Negative for pain, redness and itching.  Respiratory: Positive for cough. Negative for shortness of breath.   Cardiovascular: Negative for chest pain.  Gastrointestinal: Positive for diarrhea. Negative for abdominal pain, nausea and vomiting.  Musculoskeletal: Negative for myalgias.  Skin: Negative for rash.  Allergic/Immunologic: Negative for environmental allergies and immunocompromised state.  Neurological: Negative for dizziness, weakness and headaches.     Physical Exam Updated Vital Signs BP (!) 143/92 (BP Location: Right Arm)   Pulse (!) 106   Temp 99.9 F (37.7 C) (Oral)   Resp 18   Ht 5' 3.5" (1.613 m)   Wt 130.2 kg   LMP 03/05/2018   SpO2 97%   BMI 50.04 kg/m   Physical Exam  Constitutional: She appears well-developed and well-nourished. No distress.  HENT:  Head: Normocephalic and atraumatic.  Right Ear: External ear and ear canal normal. A middle ear effusion is present.  Left Ear: External ear and ear canal normal. A middle ear effusion is present.  Nose: Mucosal edema and rhinorrhea present.  Mouth/Throat: Uvula is midline and oropharynx  is clear and moist. No oropharyngeal exudate, posterior oropharyngeal edema or posterior oropharyngeal erythema.  No posterior oropharyngeal erythema or exudates noted on exam. Tonsils absent due to tonsillectomy.  Eyes: Conjunctivae are normal. Right eye exhibits no discharge. Left eye exhibits no discharge.  Neck: Normal range of motion. Neck supple.  Cardiovascular: Normal rate, regular rhythm and normal heart sounds. Exam reveals no gallop and no friction rub.  No murmur heard. Pulmonary/Chest: Effort normal and breath sounds normal. No respiratory distress. She has no wheezes. She has no rales.  Abdominal: Soft. She exhibits no distension. There is no tenderness. There is no guarding.  Musculoskeletal: Normal range of motion.  Lymphadenopathy:    She has no cervical adenopathy.  Neurological: She is alert.  Skin: Skin is warm. No rash noted. She is not diaphoretic.  Psychiatric: She has a normal mood and affect.  Nursing note and vitals reviewed.    ED Treatments / Results  Labs (all labs ordered are listed, but only abnormal results are displayed) Labs Reviewed - No data to display  EKG None  Radiology No results found.  Procedures Procedures (including critical care time)  Medications Ordered in ED Medications  acetaminophen (TYLENOL) tablet 650 mg (650 mg Oral Given 03/09/18 1256)     Initial Impression / Assessment and Plan / ED Course  I have reviewed the triage vital signs and the nursing notes.  Pertinent labs & imaging results that were available during my care of the patient were reviewed by me and considered in my medical decision making (see chart for details).    Patients symptoms are consistent with URI, likely viral etiology. Discussed that antibiotics are not indicated for viral infections. Pt will be discharged with symptomatic treatment. Encouraged patient to drink plenty of fluids and rest. Discussed returning to ER if symptoms do not improve or  worsen. Provided Tessalon for cough. Verbalizes understanding and is agreeable with plan. Pt is hemodynamically stable & in NAD prior to dc.   Final Clinical Impressions(s) / ED Diagnoses   Final diagnoses:  Viral URI with cough    ED Discharge Orders         Ordered    benzonatate (TESSALON) 100 MG capsule  Every 8 hours     03/09/18 1241           Carlyle Basques Harper, [place] 03/09/18 1311    Gwyneth Sprout, MD 03/09/18 2059

## 2018-03-09 NOTE — ED Triage Notes (Signed)
Sore throat since Thursday, voice is hoarse yesterday

## 2018-03-09 NOTE — Discharge Instructions (Addendum)
You have been seen today for sore throat and cough. Please read and follow all provided instructions.   1. Medications: Tessalon for cough, usual home medications 2. Treatment: rest, drink plenty of fluids 3. Follow Up: Please follow up with your primary doctor in 7 days for discussion of your diagnoses and further evaluation after today's visit; if you do not have a primary care doctor use the resource guide provided to find one; Please return to the ER for any new or worsening symptoms.   Take medications as prescribed. Return to the emergency room for worsening condition or new concerning symptoms. Follow up with your regular doctor. If you don't have a regular doctor use one of the numbers below to establish a primary care doctor.   Emergency Department Resource Guide 1) Find a Doctor and Pay Out of Pocket Although you won't have to find out who is covered by your insurance plan, it is a good idea to ask around and get recommendations. You will then need to call the office and see if the doctor you have chosen will accept you as a new patient and what types of options they offer for patients who are self-pay. Some doctors offer discounts or will set up payment plans for their patients who do not have insurance, but you will need to ask so you aren't surprised when you get to your appointment.  2) Contact Your Local Health Department Not all health departments have doctors that can see patients for sick visits, but many do, so it is worth a call to see if yours does. If you don't know where your local health department is, you can check in your phone book. The CDC also has a tool to help you locate your state's health department, and many state websites also have listings of all of their local health departments.  3) Find a Walk-in Clinic If your illness is not likely to be very severe or complicated, you may want to try a walk in clinic. These are popping up all over the country in pharmacies,  drugstores, and shopping centers. They're usually staffed by nurse practitioners or physician assistants that have been trained to treat common illnesses and complaints. They're usually fairly quick and inexpensive. However, if you have serious medical issues or chronic medical problems, these are probably not your best option.  No Primary Care Doctor: Call Health Connect at  [phone] - they can help you locate a primary care doctor that  accepts your insurance, provides certain services, etc. Physician Referral [phone]  Emergency Department Resource Guide 1) Find a Doctor and Pay Out of Pocket Although you won't have to find out who is covered by your insurance plan, it is a good idea to ask around and get recommendations. You will then need to call the office and see if the doctor you have chosen will accept you as a new patient and what types of options they offer for patients who are self-pay. Some doctors offer discounts or will set up payment plans for their patients who do not have insurance, but you will need to ask so you aren't surprised when you get to your appointment.  2) Contact Your Local Health Department Not all health departments have doctors that can see patients for sick visits, but many do, so it is worth a call to see if yours does. If you don't know where your local health department is, you can check in your phone book. The CDC also has a  tool to help you locate your state's health department, and many state websites also have listings of all of their local health departments.  3) Find a Walk-in Clinic If your illness is not likely to be very severe or complicated, you may want to try a walk in clinic. These are popping up all over the country in pharmacies, drugstores, and shopping centers. They're usually staffed by nurse practitioners or physician assistants that have been trained to treat common illnesses and complaints. They're usually fairly quick and  inexpensive. However, if you have serious medical issues or chronic medical problems, these are probably not your best option.  No Primary Care Doctor: Call Health Connect at  [phone] - they can help you locate a primary care doctor that  accepts your insurance, provides certain services, etc. Physician Referral Service- [phone]  Chronic Pain Problems: Organization         Address  Phone   Notes  [facility]  [phone] Patients need to be referred by their primary care doctor.   Medication Assistance: Organization         Address  Phone   Notes  [facility] Medication [facility] [address] [place] [place], [place] [address] [phone] --Must be a resident of [facility] -- Must have NO insurance coverage whatsoever (no Medicaid/ Medicare, etc.) -- The pt. MUST have a primary care doctor that directs their care regularly and follows them in the community   [facility]  [phone]   [employer]  [phone]    Agencies that provide inexpensive medical care: Organization         Address  Phone   Notes  [facility]  [phone]   [facility]    [phone]   [facility] [address] [place], [place] [address] [phone]   [facility] [address] [place], [place] [phone]   [facility]    [phone]   [facility]    [phone]   [facility]  [address], [place] Phone:  [phone], Fax:  [phone] Hours of Operation:  9 am - 6 pm, M-F.  Also accepts Medicaid/Medicare and self-pay.  [facility]  [address], [place] Phone: [phone], Fax: [phone]. Hours of Operation:  8:30 am - 5:30 pm, M-F.  Also accepts Medicaid and self-pay.  [facility] [place] [address] [place] Phone: [phone]   [facility] [address] Natasha Bence [place], [place] [phone], Ext. [phone] Mondays & Thursdays: 7-9 AM.  First 15 patients are seen on a first come, first serve basis.    Medicaid-accepting [facility] Providers:  Organization         Address  Phone   Notes  [facility] [address],  [phone] Also accepts self-pay patients.  [facility] [address] Laurell Josephs [place], [place]  [phone]   [facility] [address], [place] [phone]   [facility] [address], [place] [phone]   Renaye Rakers [address], [place]   [phone] Only accepts [place] patients after they have their name applied to their card.   Self-Pay (no insurance) in [facility]:  Organization  Address  Phone   Notes  Sickle Cell Patients, [facility] [place] [phone]   [facility] [place] [phone]   [facility] [place]  [place], [place] [phone]   [facility]/Dr. Osei-Bonsu  [address] [place] or [address], [place] [phone] Phone number for both [place] and [place] locations is the same.  [facility] and [facility] [address] [place] [phone]   [facility] [address] [place] or [address] [phone]   [facility] [address] [place] [phone], phone; [phone], fax Sees patients 1st and 3rd Saturday of every month.  Must not qualify for public or private insurance (i.e. Medicaid, Medicare, [place] Health Choice, Veterans' Benefits)  Household income should be no more than 200% of the poverty level The clinic cannot treat you if you are pregnant or  think you are pregnant  Sexually transmitted diseases are not treated at the clinic.

## 2018-03-09 NOTE — ED Notes (Signed)
Pt stable, ambulatory, states understanding of discharge instructions 

## 2018-07-15 ENCOUNTER — Ambulatory Visit: Payer: BC Managed Care – PPO | Admitting: Family

## 2018-09-08 ENCOUNTER — Ambulatory Visit: Payer: BC Managed Care – PPO | Admitting: Family

## 2018-11-11 ENCOUNTER — Ambulatory Visit: Payer: BC Managed Care – PPO | Admitting: Family

## 2018-11-11 DIAGNOSIS — Z0289 Encounter for other administrative examinations: Secondary | ICD-10-CM

## 2018-12-22 IMAGING — US US ABDOMEN LIMITED
1 series · 14 of 25 positions shown · non-contrast
Comparison: None.

CLINICAL DATA: 26-year-old female with right upper quadrant
abdominal pain.

EXAM:
ULTRASOUND ABDOMEN LIMITED RIGHT UPPER QUADRANT

[Series 1: us abdomen limited · 0.23mm/px · 14 of 40 slices shown]
[im 1/40]
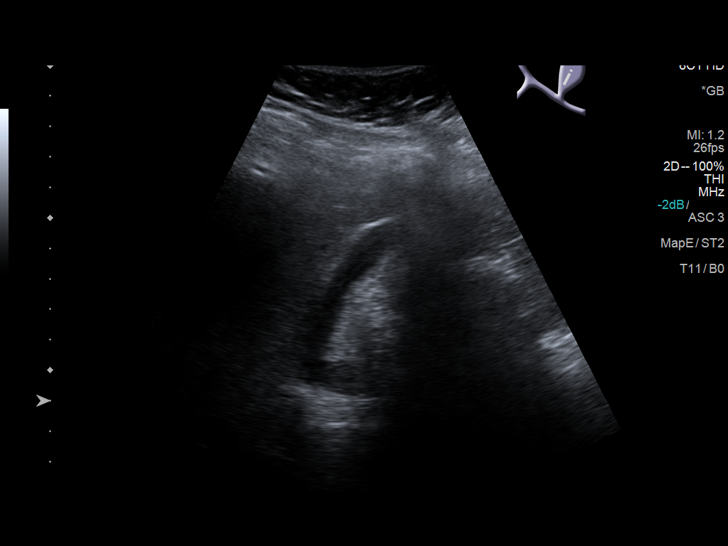
[im 4/40]
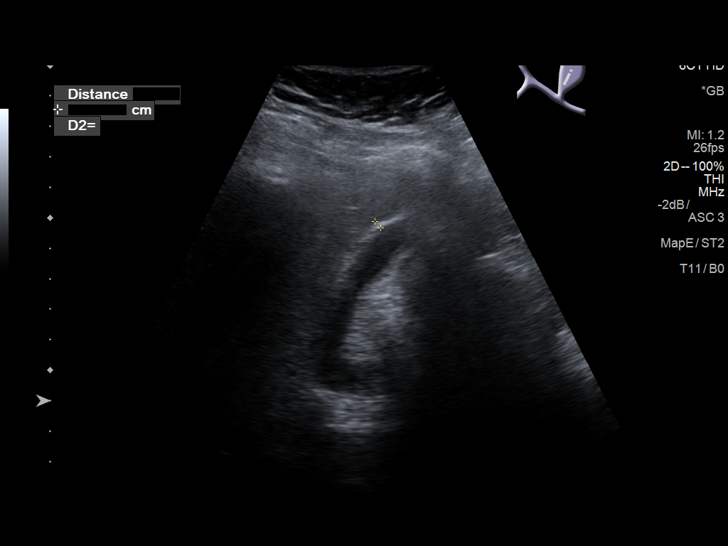
[im 7/40]
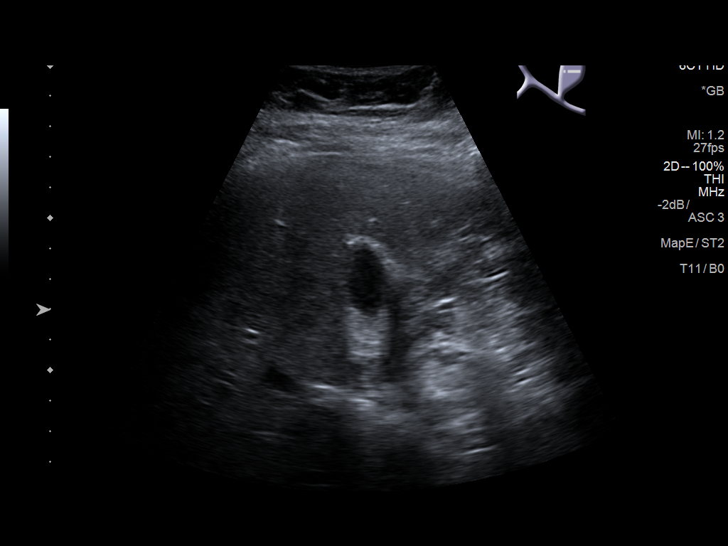
[im 10/40]
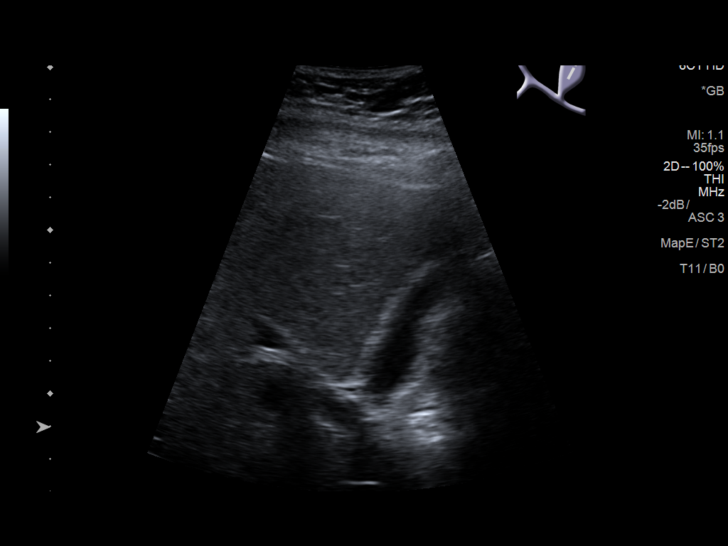
[im 14/40]
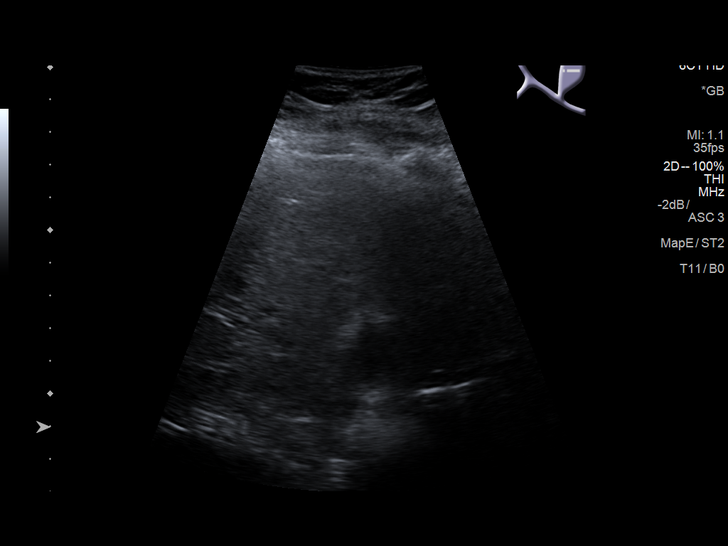
[im 15/40]
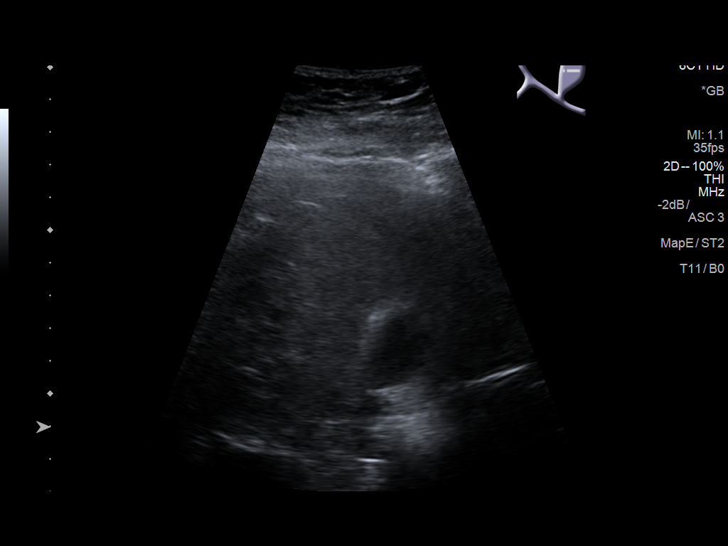
[im 18/40]
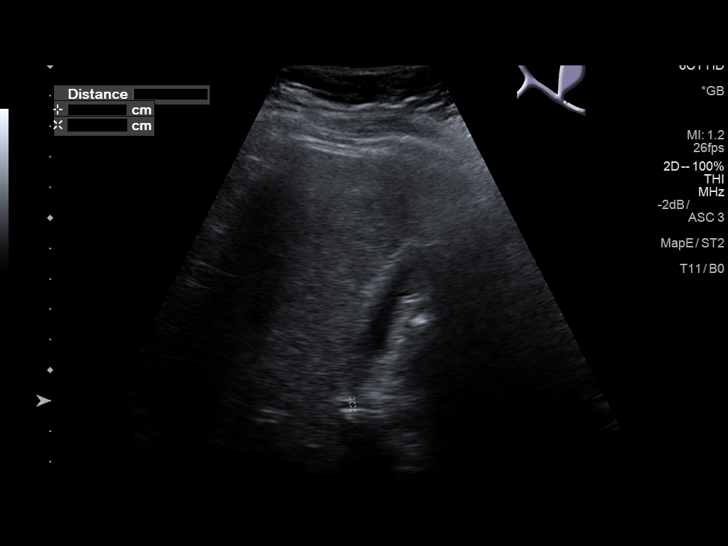
[im 22/40]
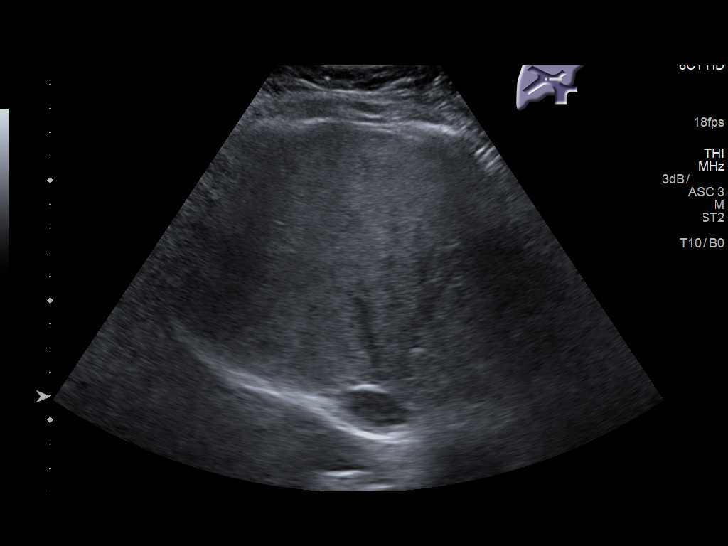
[im 25/40]
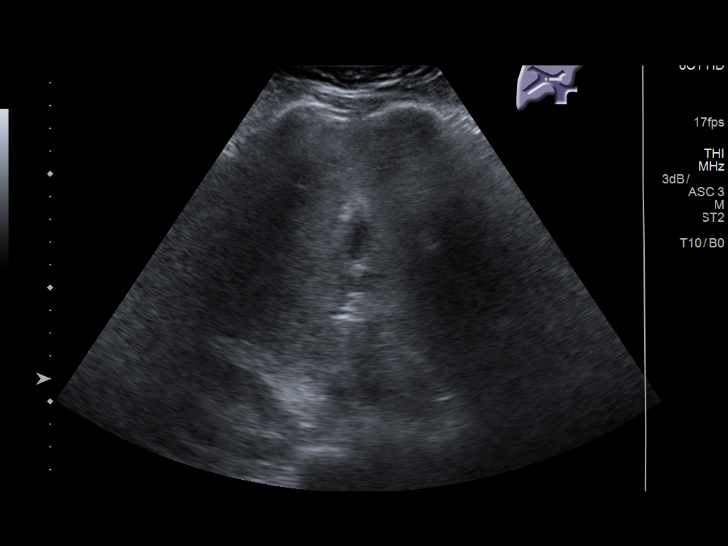
[im 27/40]
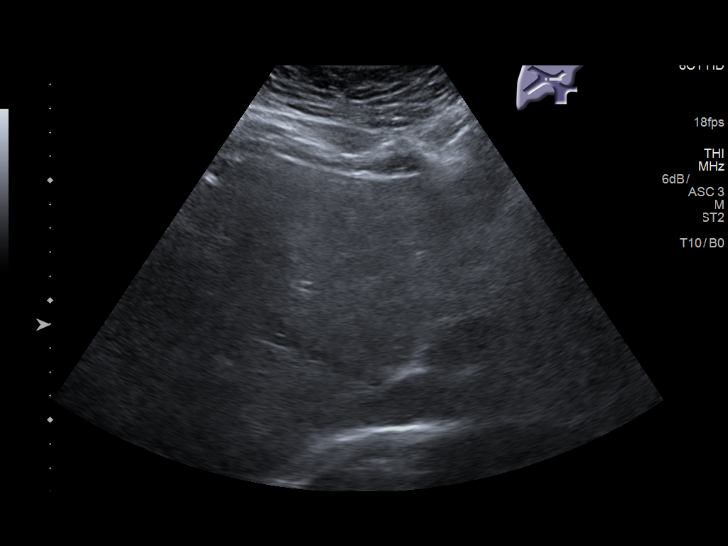
[im 30/40]
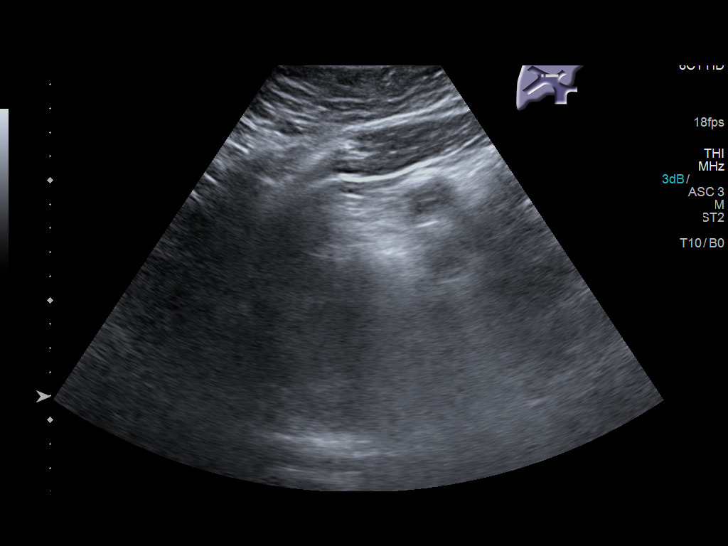
[im 33/40]
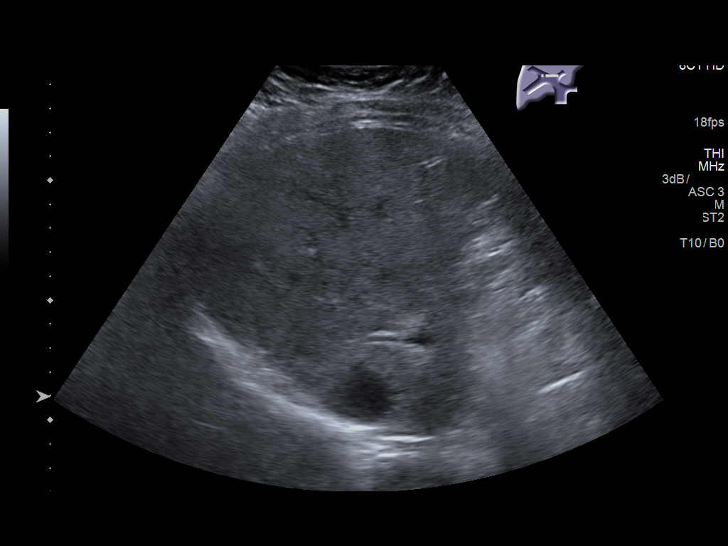
[im 36/40]
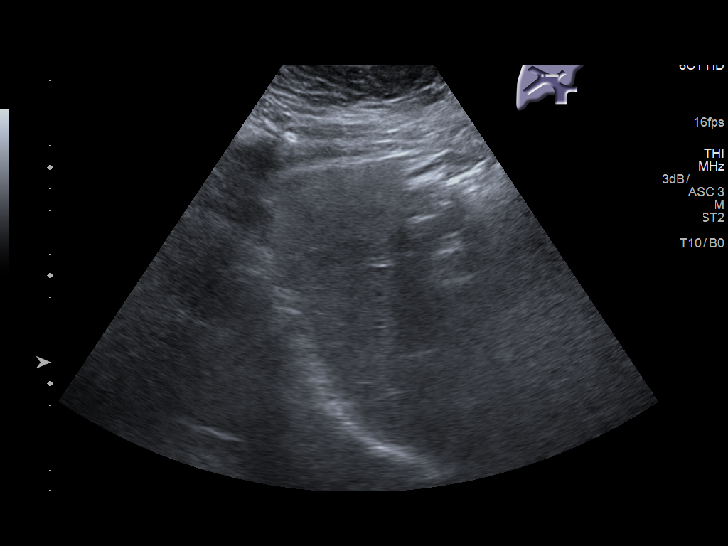
[im 40/40]
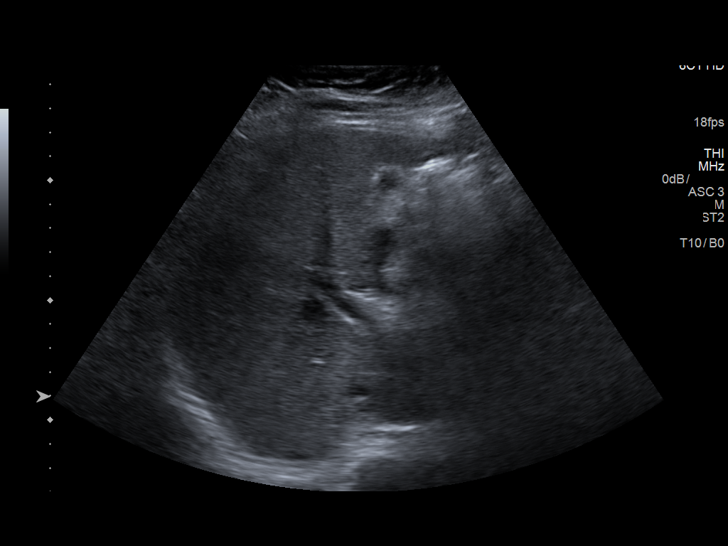

[14 of 25 positions shown; findings below may reference images not displayed]

FINDINGS: Gallbladder:

No gallstones or wall thickening visualized. No sonographic Murphy
sign noted by sonographer.

Common bile duct:

Diameter: 3 mm

Liver:

There is mild diffuse increased liver echogenicity most consistent
with fatty infiltration. Portal vein is patent on color Doppler
imaging with normal direction of blood flow towards the liver.
IMPRESSION: 1. No gallstone.
2. Fatty liver.

## 2019-03-04 ENCOUNTER — Other Ambulatory Visit: Payer: Self-pay | Admitting: Endocrinology

## 2019-03-04 DIAGNOSIS — E221 Hyperprolactinemia: Secondary | ICD-10-CM

## 2019-03-07 ENCOUNTER — Other Ambulatory Visit: Payer: Self-pay

## 2019-03-07 ENCOUNTER — Ambulatory Visit
Admission: RE | Admit: 2019-03-07 | Discharge: 2019-03-07 | Disposition: A | Discharge status: Home / Self Care | Payer: BC Managed Care – PPO | Source: Ambulatory Visit | Attending: Endocrinology | Admitting: Endocrinology

## 2019-03-07 DIAGNOSIS — E221 Hyperprolactinemia: Secondary | ICD-10-CM

## 2021-01-09 IMAGING — MR MR HEAD W/O CM
10 series · 48 of 48 positions shown · non-contrast
Comparison: None.

CLINICAL DATA: Hyperprolactinemia. Absent/irregular menstruation
for 2 years. Noncontrast brain MRI requested.

EXAM:
MRI HEAD WITHOUT CONTRAST
TECHNIQUE: Multiplanar, multiecho pulse sequences of the brain and surrounding
structures were obtained without intravenous contrast.

[Series 2: t1_se_sag · sagittal · 5.0mm · 0.45mm/px · 1 of 21 slices shown]
[im 1/21]
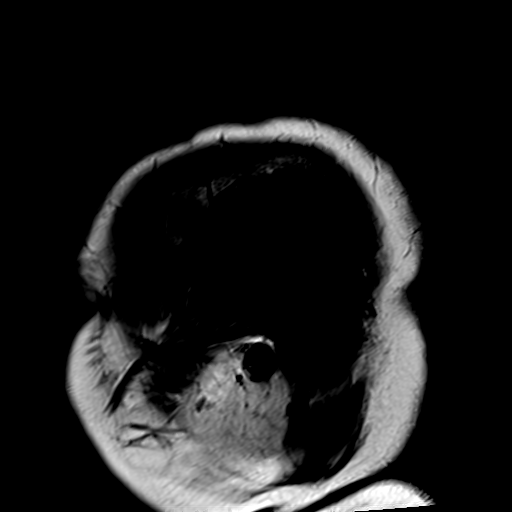

[Series 3: t2_tse_tra_512 · axial · 5.0mm · 0.60mm/px · z∈[-56,+82]mm · 2 of 24 slices shown]
[im 1/24]
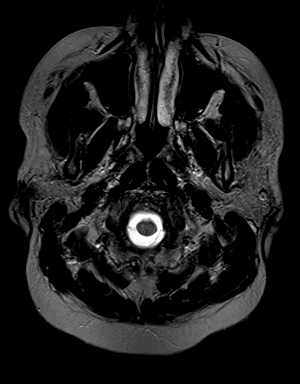
[im 24/24]
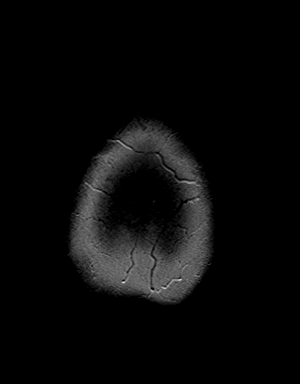

[Series 4: ep2d_diff_ · axial · 3.0mm · 1.80mm/px · z∈[-50,+85]mm · 9 of 91 slices shown]
[im 1/91]
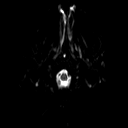
[im 12/91]
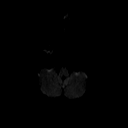
[im 23/91]
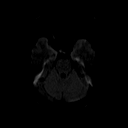
[im 34/91]
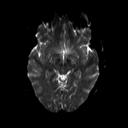
[im 46/91]
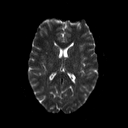
[im 57/91]
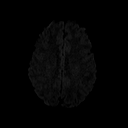
[im 68/91]
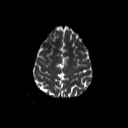
[im 79/91]
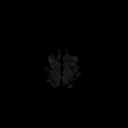
[im 91/91]
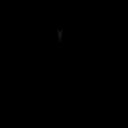

[Series 5: ep2d_diff__adc · axial · 3.0mm · 1.80mm/px · z∈[-50,+85]mm · 5 of 46 slices shown]
[im 1/46]
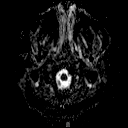
[im 12/46]
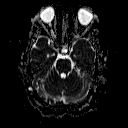
[im 23/46]
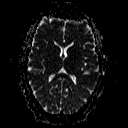
[im 34/46]
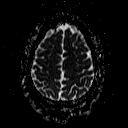
[im 46/46]
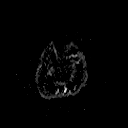

[Series 6: ep2d_diff_cor · coronal · 5.0mm · 1.77mm/px · 5 of 49 slices shown]
[im 1/49]
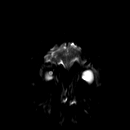
[im 13/49]
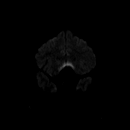
[im 25/49]
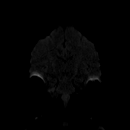
[im 37/49]
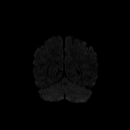
[im 49/49]
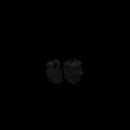

[Series 7: ep2d_diff_cor_adc · coronal · 5.0mm · 1.77mm/px · 2 of 25 slices shown]
[im 1/25]
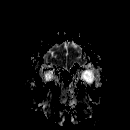
[im 25/25]
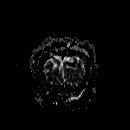

[Series 9: swi_images · axial · 4.0mm · 0.90mm/px · z∈[-55,+85]mm · 4 of 36 slices shown]
[im 1/36]
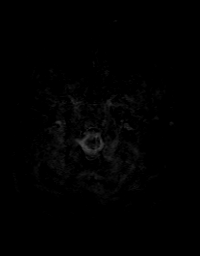
[im 12/36]
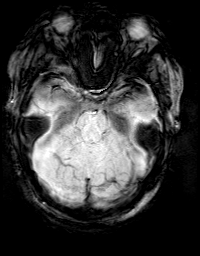
[im 24/36]
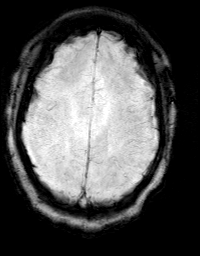
[im 36/36]
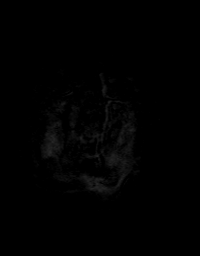

[Series 10: FLAIR · axial · 3.0mm · 0.43mm/px · z∈[-65,+91]mm · 3 of 27 slices shown]
[im 1/27]
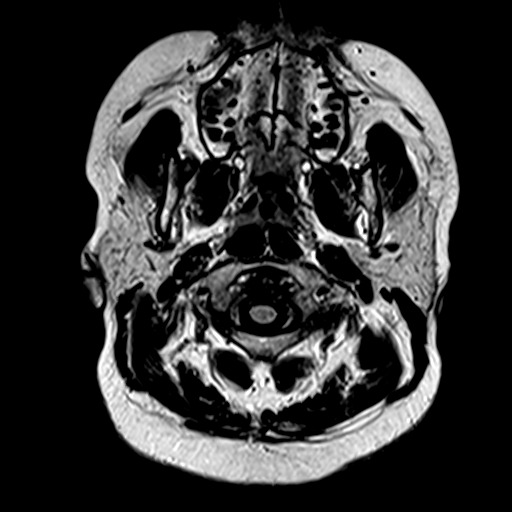
[im 14/27]
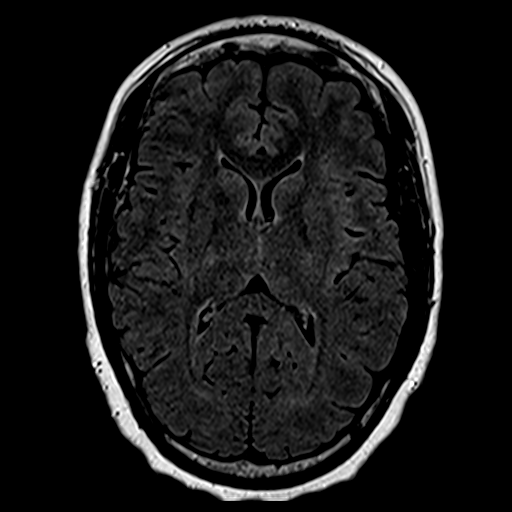
[im 27/27]
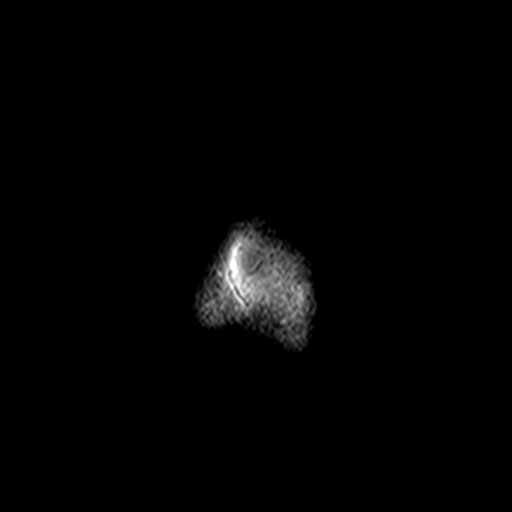

[Series 11: t1_mpr_tra · axial · 1.0mm · 0.72mm/px · z∈[-58,+85]mm · 14 of 144 slices shown]
[im 1/144]
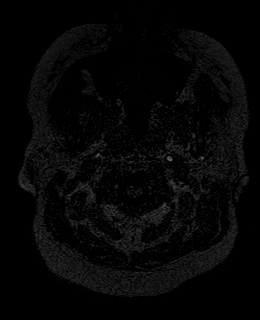
[im 12/144]
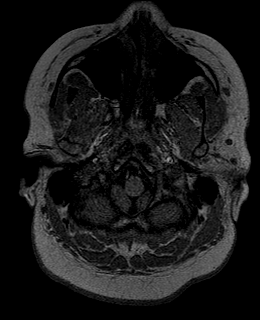
[im 23/144]
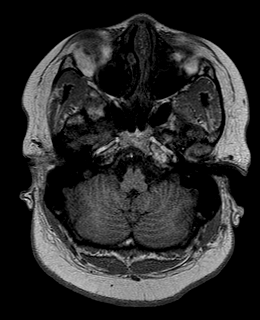
[im 34/144]
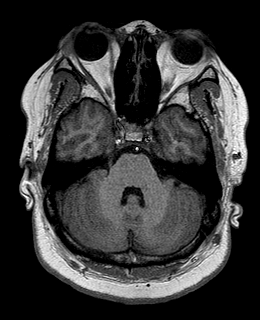
[im 45/144]
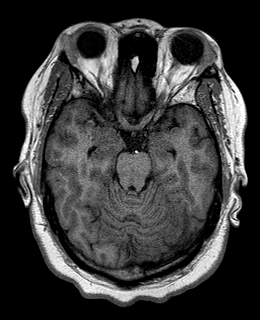
[im 56/144]
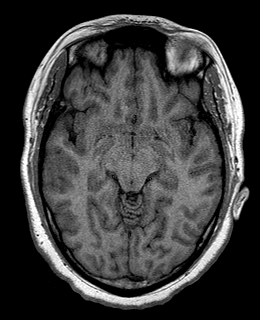
[im 67/144]
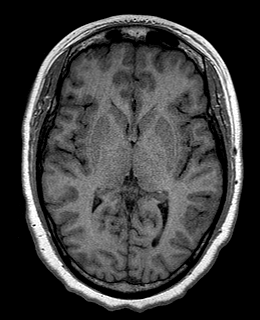
[im 78/144]
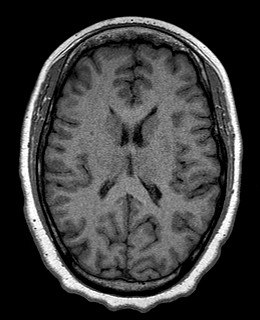
[im 89/144]
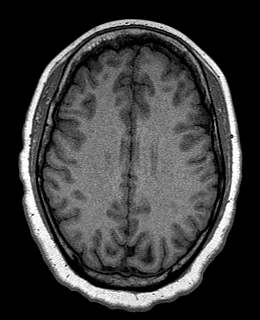
[im 100/144]
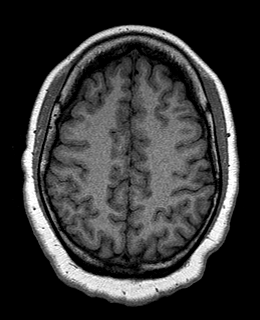
[im 111/144]
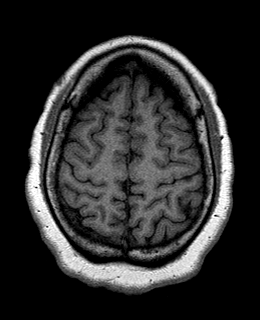
[im 122/144]
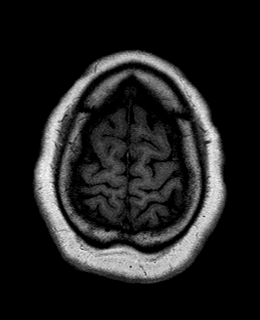
[im 133/144]
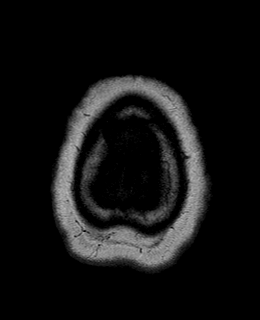
[im 144/144]
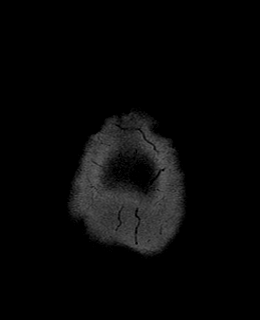

[Series 12: T2 · coronal · 5.0mm · 0.45mm/px · 3 of 26 slices shown]
[im 1/26]
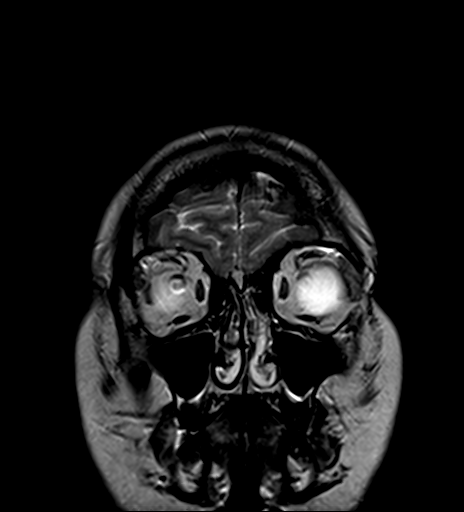
[im 13/26]
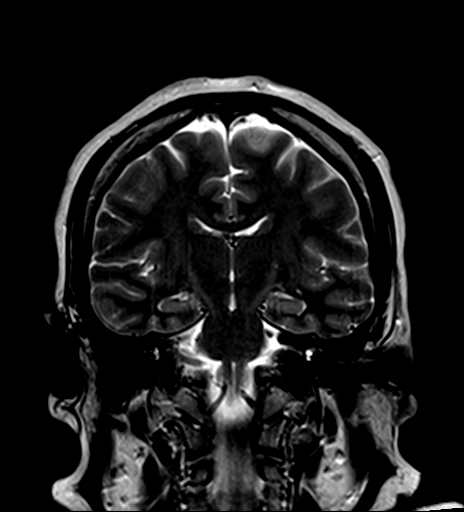
[im 26/26]
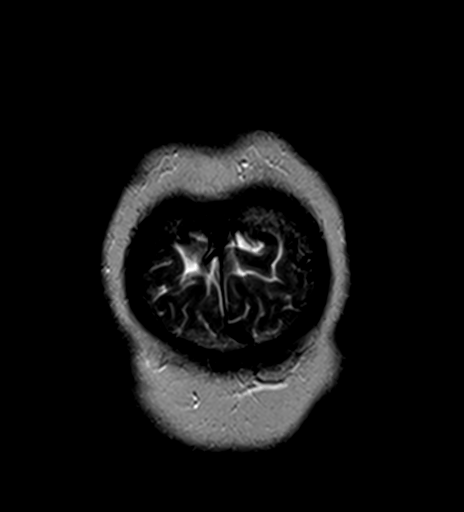

[48 of 48 positions shown; findings below may reference images not displayed]

FINDINGS: Brain: There is no evidence of acute infarct, intracranial
hemorrhage, midline shift, or extra-axial fluid collection. The
ventricles and sulci are normal. The brain is normal in signal.

Dedicated pituitary protocol imaging was not performed, however the
pituitary gland appears partially flattened along the floor of the
sella with suggestion of an asymmetric 5 mm focus of nodular soft
tissue in the right lateral aspect of the sella (series 12, image
19).

Vascular: Major intracranial vascular flow voids are preserved.

Skull and upper cervical spine: Unremarkable bone marrow signal.

Sinuses/Orbits: Unremarkable orbits. Paranasal sinuses and mastoid
air cells are clear.

Other: None.
IMPRESSION: 1. Small volume asymmetric soft tissue in the right lateral aspect
of the sella which could represent a pituitary microadenoma with
this history. If detailed pituitary assessment is desired, a
pituitary protocol brain MRI without and with contrast could be
performed.
2. Otherwise unremarkable appearance of the brain.
